# Patient Record
Sex: Female | Born: 1935
Health system: Southern US, Community
[De-identification: ages and names within clinical notes are randomized; demographics above are authoritative.]

## PROBLEM LIST (undated history)

## (undated) DIAGNOSIS — M8580 Other specified disorders of bone density and structure, unspecified site: Secondary | ICD-10-CM

## (undated) DIAGNOSIS — E061 Subacute thyroiditis: Secondary | ICD-10-CM

## (undated) DIAGNOSIS — Z8619 Personal history of other infectious and parasitic diseases: Secondary | ICD-10-CM

## (undated) DIAGNOSIS — C801 Malignant (primary) neoplasm, unspecified: Secondary | ICD-10-CM

## (undated) DIAGNOSIS — D72819 Decreased white blood cell count, unspecified: Secondary | ICD-10-CM

## (undated) DIAGNOSIS — R000 Tachycardia, unspecified: Secondary | ICD-10-CM

## (undated) DIAGNOSIS — M159 Polyosteoarthritis, unspecified: Secondary | ICD-10-CM

## (undated) DIAGNOSIS — E785 Hyperlipidemia, unspecified: Secondary | ICD-10-CM

## (undated) DIAGNOSIS — I10 Essential (primary) hypertension: Secondary | ICD-10-CM

## (undated) DIAGNOSIS — I499 Cardiac arrhythmia, unspecified: Secondary | ICD-10-CM

## (undated) DIAGNOSIS — D509 Iron deficiency anemia, unspecified: Secondary | ICD-10-CM

## (undated) HISTORY — PX: OTHER SURGICAL HISTORY: SHX169

## (undated) HISTORY — PX: EYE SURGERY: SHX253

## (undated) HISTORY — DX: Malignant (primary) neoplasm, unspecified: C80.1

---

## 2001-05-23 HISTORY — PX: CHOLECYSTECTOMY: SHX55

## 2005-01-31 ENCOUNTER — Ambulatory Visit: Payer: Self-pay | Admitting: Internal Medicine

## 2006-02-03 ENCOUNTER — Ambulatory Visit: Payer: Self-pay | Admitting: Internal Medicine

## 2007-02-06 ENCOUNTER — Ambulatory Visit: Payer: Self-pay | Admitting: Internal Medicine

## 2008-02-12 ENCOUNTER — Ambulatory Visit: Payer: Self-pay | Admitting: Internal Medicine

## 2009-02-17 ENCOUNTER — Ambulatory Visit: Payer: Self-pay | Admitting: Internal Medicine

## 2009-10-05 ENCOUNTER — Ambulatory Visit: Payer: Self-pay

## 2010-03-08 ENCOUNTER — Ambulatory Visit: Payer: Self-pay | Admitting: Internal Medicine

## 2011-04-07 ENCOUNTER — Ambulatory Visit: Payer: Self-pay | Admitting: Internal Medicine

## 2012-04-09 ENCOUNTER — Ambulatory Visit: Payer: Self-pay | Admitting: Internal Medicine

## 2013-04-11 ENCOUNTER — Ambulatory Visit: Payer: Self-pay | Admitting: Internal Medicine

## 2014-04-14 ENCOUNTER — Ambulatory Visit: Payer: Self-pay | Admitting: Internal Medicine

## 2014-08-22 ENCOUNTER — Emergency Department: Admit: 2014-08-22 | Discharge status: Against Medical Advice | Payer: Self-pay | Admitting: Emergency Medicine

## 2015-05-27 DIAGNOSIS — R030 Elevated blood-pressure reading, without diagnosis of hypertension: Secondary | ICD-10-CM | POA: Diagnosis not present

## 2015-06-08 DIAGNOSIS — R030 Elevated blood-pressure reading, without diagnosis of hypertension: Secondary | ICD-10-CM | POA: Diagnosis not present

## 2015-06-08 DIAGNOSIS — M8580 Other specified disorders of bone density and structure, unspecified site: Secondary | ICD-10-CM | POA: Diagnosis not present

## 2015-06-08 DIAGNOSIS — E784 Other hyperlipidemia: Secondary | ICD-10-CM | POA: Diagnosis not present

## 2015-06-08 DIAGNOSIS — M150 Primary generalized (osteo)arthritis: Secondary | ICD-10-CM | POA: Diagnosis not present

## 2015-06-08 DIAGNOSIS — E061 Subacute thyroiditis: Secondary | ICD-10-CM | POA: Diagnosis not present

## 2015-06-08 DIAGNOSIS — D72818 Other decreased white blood cell count: Secondary | ICD-10-CM | POA: Diagnosis not present

## 2015-11-30 DIAGNOSIS — E784 Other hyperlipidemia: Secondary | ICD-10-CM | POA: Diagnosis not present

## 2015-11-30 DIAGNOSIS — D72818 Other decreased white blood cell count: Secondary | ICD-10-CM | POA: Diagnosis not present

## 2015-11-30 DIAGNOSIS — R030 Elevated blood-pressure reading, without diagnosis of hypertension: Secondary | ICD-10-CM | POA: Diagnosis not present

## 2015-12-07 DIAGNOSIS — D72818 Other decreased white blood cell count: Secondary | ICD-10-CM | POA: Diagnosis not present

## 2015-12-07 DIAGNOSIS — E061 Subacute thyroiditis: Secondary | ICD-10-CM | POA: Diagnosis not present

## 2015-12-07 DIAGNOSIS — R030 Elevated blood-pressure reading, without diagnosis of hypertension: Secondary | ICD-10-CM | POA: Diagnosis not present

## 2015-12-07 DIAGNOSIS — E784 Other hyperlipidemia: Secondary | ICD-10-CM | POA: Diagnosis not present

## 2015-12-07 DIAGNOSIS — M150 Primary generalized (osteo)arthritis: Secondary | ICD-10-CM | POA: Diagnosis not present

## 2015-12-07 DIAGNOSIS — M8589 Other specified disorders of bone density and structure, multiple sites: Secondary | ICD-10-CM | POA: Diagnosis not present

## 2016-03-18 DIAGNOSIS — E061 Subacute thyroiditis: Secondary | ICD-10-CM | POA: Diagnosis not present

## 2016-03-18 DIAGNOSIS — M150 Primary generalized (osteo)arthritis: Secondary | ICD-10-CM | POA: Diagnosis not present

## 2016-03-18 DIAGNOSIS — E784 Other hyperlipidemia: Secondary | ICD-10-CM | POA: Diagnosis not present

## 2016-03-18 DIAGNOSIS — R030 Elevated blood-pressure reading, without diagnosis of hypertension: Secondary | ICD-10-CM | POA: Diagnosis not present

## 2016-03-25 DIAGNOSIS — E061 Subacute thyroiditis: Secondary | ICD-10-CM | POA: Diagnosis not present

## 2016-03-25 DIAGNOSIS — D72818 Other decreased white blood cell count: Secondary | ICD-10-CM | POA: Diagnosis not present

## 2016-03-25 DIAGNOSIS — R030 Elevated blood-pressure reading, without diagnosis of hypertension: Secondary | ICD-10-CM | POA: Diagnosis not present

## 2016-03-25 DIAGNOSIS — E784 Other hyperlipidemia: Secondary | ICD-10-CM | POA: Diagnosis not present

## 2016-03-25 DIAGNOSIS — M150 Primary generalized (osteo)arthritis: Secondary | ICD-10-CM | POA: Diagnosis not present

## 2016-03-25 DIAGNOSIS — M8589 Other specified disorders of bone density and structure, multiple sites: Secondary | ICD-10-CM | POA: Diagnosis not present

## 2016-03-25 DIAGNOSIS — Z0000 Encounter for general adult medical examination without abnormal findings: Secondary | ICD-10-CM | POA: Diagnosis not present

## 2016-03-25 DIAGNOSIS — Z23 Encounter for immunization: Secondary | ICD-10-CM | POA: Diagnosis not present

## 2016-03-25 DIAGNOSIS — R946 Abnormal results of thyroid function studies: Secondary | ICD-10-CM | POA: Diagnosis not present

## 2016-04-25 DIAGNOSIS — R946 Abnormal results of thyroid function studies: Secondary | ICD-10-CM | POA: Diagnosis not present

## 2016-07-01 DIAGNOSIS — R000 Tachycardia, unspecified: Secondary | ICD-10-CM | POA: Diagnosis not present

## 2016-07-01 DIAGNOSIS — R030 Elevated blood-pressure reading, without diagnosis of hypertension: Secondary | ICD-10-CM | POA: Diagnosis not present

## 2016-09-15 DIAGNOSIS — D72818 Other decreased white blood cell count: Secondary | ICD-10-CM | POA: Diagnosis not present

## 2016-09-15 DIAGNOSIS — E784 Other hyperlipidemia: Secondary | ICD-10-CM | POA: Diagnosis not present

## 2016-09-15 DIAGNOSIS — R030 Elevated blood-pressure reading, without diagnosis of hypertension: Secondary | ICD-10-CM | POA: Diagnosis not present

## 2016-09-15 DIAGNOSIS — M150 Primary generalized (osteo)arthritis: Secondary | ICD-10-CM | POA: Diagnosis not present

## 2016-09-15 DIAGNOSIS — R946 Abnormal results of thyroid function studies: Secondary | ICD-10-CM | POA: Diagnosis not present

## 2016-09-21 DIAGNOSIS — Z1211 Encounter for screening for malignant neoplasm of colon: Secondary | ICD-10-CM | POA: Diagnosis not present

## 2016-09-22 DIAGNOSIS — M150 Primary generalized (osteo)arthritis: Secondary | ICD-10-CM | POA: Diagnosis not present

## 2016-09-22 DIAGNOSIS — R202 Paresthesia of skin: Secondary | ICD-10-CM | POA: Diagnosis not present

## 2016-09-22 DIAGNOSIS — D649 Anemia, unspecified: Secondary | ICD-10-CM | POA: Insufficient documentation

## 2016-09-22 DIAGNOSIS — E784 Other hyperlipidemia: Secondary | ICD-10-CM | POA: Diagnosis not present

## 2016-09-22 DIAGNOSIS — D72818 Other decreased white blood cell count: Secondary | ICD-10-CM | POA: Diagnosis not present

## 2016-09-22 DIAGNOSIS — M8589 Other specified disorders of bone density and structure, multiple sites: Secondary | ICD-10-CM | POA: Diagnosis not present

## 2016-09-22 DIAGNOSIS — E061 Subacute thyroiditis: Secondary | ICD-10-CM | POA: Diagnosis not present

## 2016-09-22 DIAGNOSIS — R030 Elevated blood-pressure reading, without diagnosis of hypertension: Secondary | ICD-10-CM | POA: Diagnosis not present

## 2016-09-26 ENCOUNTER — Inpatient Hospital Stay: Payer: PPO | Attending: Oncology | Admitting: Oncology

## 2016-09-26 ENCOUNTER — Encounter: Payer: Self-pay | Admitting: Oncology

## 2016-09-26 ENCOUNTER — Other Ambulatory Visit: Payer: Self-pay | Admitting: Oncology

## 2016-09-26 ENCOUNTER — Ambulatory Visit: Payer: Self-pay

## 2016-09-26 ENCOUNTER — Inpatient Hospital Stay: Payer: PPO

## 2016-09-26 VITALS — BP 149/74 | HR 101 | Temp 98.0°F | Ht 65.5 in | Wt 130.5 lb

## 2016-09-26 VITALS — BP 150/72 | HR 98 | Temp 97.6°F | Resp 20

## 2016-09-26 DIAGNOSIS — Z79899 Other long term (current) drug therapy: Secondary | ICD-10-CM | POA: Insufficient documentation

## 2016-09-26 DIAGNOSIS — R002 Palpitations: Secondary | ICD-10-CM | POA: Insufficient documentation

## 2016-09-26 DIAGNOSIS — E785 Hyperlipidemia, unspecified: Secondary | ICD-10-CM | POA: Diagnosis not present

## 2016-09-26 DIAGNOSIS — M8580 Other specified disorders of bone density and structure, unspecified site: Secondary | ICD-10-CM | POA: Diagnosis not present

## 2016-09-26 DIAGNOSIS — R531 Weakness: Secondary | ICD-10-CM | POA: Insufficient documentation

## 2016-09-26 DIAGNOSIS — R42 Dizziness and giddiness: Secondary | ICD-10-CM

## 2016-09-26 DIAGNOSIS — R5383 Other fatigue: Secondary | ICD-10-CM | POA: Diagnosis not present

## 2016-09-26 DIAGNOSIS — R000 Tachycardia, unspecified: Secondary | ICD-10-CM | POA: Insufficient documentation

## 2016-09-26 DIAGNOSIS — Z9049 Acquired absence of other specified parts of digestive tract: Secondary | ICD-10-CM | POA: Diagnosis not present

## 2016-09-26 DIAGNOSIS — D509 Iron deficiency anemia, unspecified: Secondary | ICD-10-CM

## 2016-09-26 DIAGNOSIS — E061 Subacute thyroiditis: Secondary | ICD-10-CM | POA: Diagnosis not present

## 2016-09-26 DIAGNOSIS — R030 Elevated blood-pressure reading, without diagnosis of hypertension: Secondary | ICD-10-CM | POA: Insufficient documentation

## 2016-09-26 DIAGNOSIS — I10 Essential (primary) hypertension: Secondary | ICD-10-CM | POA: Insufficient documentation

## 2016-09-26 DIAGNOSIS — R0602 Shortness of breath: Secondary | ICD-10-CM | POA: Insufficient documentation

## 2016-09-26 LAB — CBC WITH DIFFERENTIAL/PLATELET
BASOS PCT: 1 %
Basophils Absolute: 0 10*3/uL (ref 0–0.1)
EOS ABS: 0 10*3/uL (ref 0–0.7)
Eosinophils Relative: 1 %
HCT: 24 % — ABNORMAL LOW (ref 35.0–47.0)
Hemoglobin: 7.1 g/dL — ABNORMAL LOW (ref 12.0–16.0)
Lymphocytes Relative: 26 %
Lymphs Abs: 1.1 10*3/uL (ref 1.0–3.6)
MCH: 20.3 pg — ABNORMAL LOW (ref 26.0–34.0)
MCHC: 29.6 g/dL — AB (ref 32.0–36.0)
MCV: 68.6 fL — ABNORMAL LOW (ref 80.0–100.0)
MONO ABS: 0.4 10*3/uL (ref 0.2–0.9)
MONOS PCT: 10 %
Neutro Abs: 2.7 10*3/uL (ref 1.4–6.5)
Neutrophils Relative %: 62 %
Platelets: 413 10*3/uL (ref 150–440)
RBC: 3.5 MIL/uL — ABNORMAL LOW (ref 3.80–5.20)
RDW: 19.3 % — ABNORMAL HIGH (ref 11.5–14.5)
WBC: 4.3 10*3/uL (ref 3.6–11.0)

## 2016-09-26 LAB — SAMPLE TO BLOOD BANK

## 2016-09-26 LAB — VITAMIN B12: VITAMIN B 12: 1454 pg/mL — AB (ref 180–914)

## 2016-09-26 MED ORDER — SODIUM CHLORIDE 0.9 % IV SOLN
Freq: Once | INTRAVENOUS | Status: AC
  Administered 2016-09-26: 10:00:00 via INTRAVENOUS
  Filled 2016-09-26: qty 1000

## 2016-09-26 MED ORDER — SODIUM CHLORIDE 0.9 % IV SOLN
510.0000 mg | Freq: Once | INTRAVENOUS | Status: AC
  Administered 2016-09-26: 510 mg via INTRAVENOUS
  Filled 2016-09-26: qty 17

## 2016-09-26 NOTE — Addendum Note (Signed)
Addended by: Luella Cook on: 09/26/2016 10:06 AM   Modules accepted: Orders

## 2016-09-26 NOTE — Addendum Note (Signed)
Addended by: Luella Cook on: 09/26/2016 09:23 AM   Modules accepted: Orders

## 2016-09-26 NOTE — Addendum Note (Signed)
Addended by: Luella Cook on: 09/26/2016 10:17 AM   Modules accepted: Orders

## 2016-09-26 NOTE — Patient Instructions (Signed)

## 2016-09-26 NOTE — Progress Notes (Signed)
Hematology/Oncology Consult note [facility] Telephone:([phone] [phone]  No care team member to display   Name of the patient: Sara Smith  [id number]  [birth year]    Reason for referral- iron deficiency anemia   Referring physician- Dr. Caryl Comes  Date of visit: 09/26/16   History of presenting illness- patient is a 81 year old female with a past medical history significant for hypertension and hyperlipidemia, and other medical problems. She was seen by Dr. Cleda Mccreedy her primary care doctor for symptoms of shortness of breath on exertion and blood work revealed significant anemia. Hemoccult cards were negative for blood. Most recent CBC from 09/22/2016 showed white count of 5.2, H&H of 7.0/23.8 with an MCV of 70.8 and platelet count of 425. Iron studies revealed ferratin of 4 and total iron at T12. Folate acid was within normal limits. TSH was normal at 3.8. Urinalysis was negative for hematuria.In Oct 2017 her H/H was 12.9/39.2  She has never seen GI and never had EGD or colonoscopy. Denies any blood in stools or urine. Denies frequent use of NSAIDS. No family h/o colon cancer. h/o 14 pound weight loss intentionally through diet. Reports fatigue, palpitations and sob on exertion. Occasional lightheadedness on standing. She is not currently on iron supplements. Denies any gum bleeds or nose bleeds  ECOG PS- 1  Pain scale- 0   Review of systems- Review of Systems  Constitutional: Positive for malaise/fatigue and weight loss. Negative for chills and fever.  HENT: Negative for congestion, ear discharge and nosebleeds.   Eyes: Negative for blurred vision.  Respiratory: Positive for shortness of breath. Negative for cough, hemoptysis, sputum production and wheezing.   Cardiovascular: Positive for palpitations. Negative for chest pain, orthopnea and claudication.  Gastrointestinal: Negative for abdominal pain, blood in stool, constipation, diarrhea,  heartburn, melena, nausea and vomiting.  Genitourinary: Negative for dysuria, flank pain, frequency, hematuria and urgency.  Musculoskeletal: Negative for back pain, joint pain and myalgias.  Skin: Negative for rash.  Neurological: Positive for weakness. Negative for dizziness, tingling, focal weakness, seizures and headaches.  Endo/Heme/Allergies: Does not bruise/bleed easily.  Psychiatric/Behavioral: Negative for depression and suicidal ideas. The patient does not have insomnia.     Allergies  Allergen Reactions  . Calcitonin ([place]) Other (See Comments)  . Ciprofloxacin Nausea Only  . Risedronate Other (See Comments)    intolerant of secondary to esophagitis    Patient Active Problem List   Diagnosis Date Noted  . Borderline hypertension 09/26/2016  . Hyperlipidemia, unspecified 09/26/2016  . Osteopenia 09/26/2016  . Anemia, unspecified 09/22/2016  . Tachycardia 07/01/2016  . Subacute thyroiditis 08/21/2009     Past Medical History:  Diagnosis Date  . Arthritis      Past Surgical History:  Procedure Laterality Date  . CHOLECYSTECTOMY    . hyperlipedemia N/A   . irregular HR      Social History   Social History  . Marital status: Married    Spouse name: N/A  . Number of children: N/A  . Years of education: N/A   Occupational History  . Not on file.   Social History Main Topics  . Smoking status: Never Smoker  . Smokeless tobacco: Never Used  . Alcohol use Yes  . Drug use: No  . Sexual activity: Not on file   Other Topics Concern  . Not on file   Social History Narrative  . No narrative on file     Family History  Problem Relation Age of Onset  .  Heart disease Father   . Hypertension Father      Current Outpatient Prescriptions:  .  atorvastatin (LIPITOR) 10 MG tablet, Take by mouth., Disp: , Rfl:  .  azelastine (ASTELIN) 0.1 % nasal spray, instill 2 sprays into each nostril twice a day for 7 days then twice a day if needed, Disp: , Rfl:    .  B Complex Vitamins (B-COMPLEX/B-12) LIQD, Place under the tongue., Disp: , Rfl:  .  calcium-vitamin D (OSCAL WITH D) 250-125 MG-UNIT tablet, Take 1 tablet by mouth daily., Disp: , Rfl:  .  Multiple Vitamins-Minerals (GLUCOTEN) CAPS, Take by mouth., Disp: , Rfl:  .  Multiple Vitamins-Minerals (MULTIVITAMIN WITH MINERALS) tablet, Take by mouth., Disp: , Rfl:  .  pantoprazole (PROTONIX) 40 MG tablet, Take by mouth., Disp: , Rfl:  .  tretinoin (RETIN-A) 0.1 % cream, Apply topically., Disp: , Rfl:    Physical exam:  Vitals:   09/26/16 0834  BP: (!) 149/74  Pulse: (!) 101  Temp: 98 F (36.7 C)  TempSrc: Tympanic  Weight: 130 lb 8.2 oz (59.2 kg)  Height: 5' 5.5" (1.664 m)   Physical Exam  Constitutional: She is oriented to person, place, and time and well-developed, well-nourished, and in no distress.  HENT:  Head: Normocephalic and atraumatic.  Eyes: EOM are normal. Pupils are equal, round, and reactive to light.  Neck: Normal range of motion.  Cardiovascular: Regular rhythm and normal heart sounds.   tachycardic  Pulmonary/Chest: Effort normal and breath sounds normal.  Abdominal: Soft. Bowel sounds are normal.  Neurological: She is alert and oriented to person, place, and time.  Skin: Skin is warm and dry.       No flowsheet data found. No flowsheet data found.  No images are attached to the encounter.  No results found.  Assessment and plan- Patient is a 81 y.o. female referred to [place] for iron deficiency anemia  Recent blood work revealed severe iron deficiency and given that her hemoglobin most recently was 7, I will plan to start IV iron feraheme 510 mg X 2 doses weekly. She will start taking oral iron 325 mg BID every other day after 2 doses of IV iron  I discussed risks and benefits  with of IV iron including all but not limited to fatigue, leg swelling, headache and risk of transfusion recation. Patient understands and agrees to proceed.  I will check cbc today  along with b12, celiac disease panel and stool H pylori ag and see if she needs blood transfusion prior. I will plan to repeat iron studies, retic count,  in about 8 weeks from now. I will also refer her to GI for her iron deficiency anemia. I will see her back in 8 weeks for possible IV iron at that time   Thank you for this kind referral and the opportunity to participate in the care of this patient   Visit Diagnosis 1. Iron deficiency anemia, unspecified iron deficiency anemia type     Dr. Randa Evens, MD, MPH [facility] Pager- [phone] 09/26/2016  9:16 AM

## 2016-09-26 NOTE — Progress Notes (Signed)
Patient here for initial visit. Her BP and HR are elevated today. She states she was rushed today to get here.

## 2016-09-27 ENCOUNTER — Inpatient Hospital Stay: Payer: PPO

## 2016-09-27 DIAGNOSIS — D509 Iron deficiency anemia, unspecified: Secondary | ICD-10-CM

## 2016-09-27 LAB — PREPARE RBC (CROSSMATCH)

## 2016-09-27 LAB — ABO/RH: ABO/RH(D): A POS

## 2016-09-27 MED ORDER — SODIUM CHLORIDE 0.9 % IV SOLN
250.0000 mL | Freq: Once | INTRAVENOUS | Status: AC
  Administered 2016-09-27: 250 mL via INTRAVENOUS
  Filled 2016-09-27: qty 250

## 2016-09-27 MED ORDER — ACETAMINOPHEN 325 MG PO TABS
650.0000 mg | ORAL_TABLET | Freq: Once | ORAL | Status: AC
  Administered 2016-09-27: 650 mg via ORAL
  Filled 2016-09-27: qty 2

## 2016-09-28 LAB — TYPE AND SCREEN
ABO/RH(D): A POS
ANTIBODY SCREEN: NEGATIVE
Unit division: 0

## 2016-09-28 LAB — CELIAC DISEASE PANEL
Endomysial Ab, IgA: NEGATIVE
IgA: 191 mg/dL (ref 64–422)
Tissue Transglutaminase Ab, IgA: 20 U/mL — ABNORMAL HIGH (ref 0–3)

## 2016-09-28 LAB — BPAM RBC
Blood Product Expiration Date: 201805152359
ISSUE DATE / TIME: 201805081116
Unit Type and Rh: 6200

## 2016-09-29 ENCOUNTER — Encounter: Payer: Self-pay | Admitting: Internal Medicine

## 2016-09-29 LAB — H. PYLORI ANTIGEN, STOOL: H. Pylori Stool Ag, Eia: POSITIVE — AB

## 2016-09-29 NOTE — Progress Notes (Signed)
Protonix 40 BID+ clarithromycin 500 BID +amox 1gm twice daily for 14 days

## 2016-09-30 ENCOUNTER — Telehealth: Payer: Self-pay | Admitting: *Deleted

## 2016-09-30 ENCOUNTER — Other Ambulatory Visit: Payer: Self-pay | Admitting: *Deleted

## 2016-09-30 DIAGNOSIS — E061 Subacute thyroiditis: Secondary | ICD-10-CM | POA: Diagnosis not present

## 2016-09-30 DIAGNOSIS — D649 Anemia, unspecified: Secondary | ICD-10-CM | POA: Diagnosis not present

## 2016-09-30 DIAGNOSIS — E784 Other hyperlipidemia: Secondary | ICD-10-CM | POA: Diagnosis not present

## 2016-09-30 DIAGNOSIS — A048 Other specified bacterial intestinal infections: Secondary | ICD-10-CM

## 2016-09-30 DIAGNOSIS — R000 Tachycardia, unspecified: Secondary | ICD-10-CM | POA: Diagnosis not present

## 2016-09-30 MED ORDER — CLARITHROMYCIN 500 MG PO TABS: 500.0000 mg | ORAL_TABLET | Freq: Two times a day (BID) | ORAL | 0 refills | Status: DC

## 2016-09-30 MED ORDER — PANTOPRAZOLE SODIUM 40 MG PO TBEC: 40.0000 mg | DELAYED_RELEASE_TABLET | Freq: Two times a day (BID) | ORAL | 0 refills | Status: AC

## 2016-09-30 MED ORDER — AMOXICILLIN 500 MG PO TABS: 1000.0000 mg | ORAL_TABLET | Freq: Two times a day (BID) | ORAL | 0 refills | Status: DC

## 2016-09-30 NOTE — Telephone Encounter (Signed)
Called pt to let her know that the stool test she had done did come back with positive for H Pylori and she will need to be on 2 atb and 1 stomach pill for 14 days.  After she has completed the regimen she needs to call me. I will need to test her again about10 days after completing the atb and see if the bacteria is gone. She is agreeable to the plan. I gave her the direct number to me and she uses [facility] on [address]. It was electronically sent to her pharmacy. She also wants to cancel appt with GI Dr. Allen Norris.  She already has one with [facility] and her husband sees [facility] and she would like to go there. I called and cancelled Dr. Allen Norris appt left message on the GI line to cancel. Patient aware that I was cancelling the appt.

## 2016-09-30 NOTE — Telephone Encounter (Signed)
-----   Message from Sindy Guadeloupe, MD sent at 09/29/2016  8:12 AM EDT ----- Protonix 40 BID+ clarithromycin 500 BID +amox 1gm twice daily for 14 days

## 2016-09-30 NOTE — Telephone Encounter (Signed)
Called pt back and left her a message that she should hold her regular protonix that she takes daily and take the rx we sent for the same drug but twice a day and after she completes the atb she can go back to her usual daily dose.  Also there is a risk of liver issues if pt takes the cholesterol pil atorvastatin with the atb so we would like her to hold the atorvastatin until she completes the atb then resume the atorvastatin after she completes the atb. I have asked her to call me to make sure she understands directions and that she does not have any questions for me

## 2016-11-01 ENCOUNTER — Ambulatory Visit: Payer: Self-pay | Admitting: Gastroenterology

## 2016-11-01 DIAGNOSIS — R894 Abnormal immunological findings in specimens from other organs, systems and tissues: Secondary | ICD-10-CM | POA: Diagnosis not present

## 2016-11-01 DIAGNOSIS — Z8619 Personal history of other infectious and parasitic diseases: Secondary | ICD-10-CM | POA: Diagnosis not present

## 2016-11-01 DIAGNOSIS — D509 Iron deficiency anemia, unspecified: Secondary | ICD-10-CM | POA: Diagnosis not present

## 2016-11-14 DIAGNOSIS — R030 Elevated blood-pressure reading, without diagnosis of hypertension: Secondary | ICD-10-CM | POA: Diagnosis not present

## 2016-11-14 DIAGNOSIS — M150 Primary generalized (osteo)arthritis: Secondary | ICD-10-CM | POA: Diagnosis not present

## 2016-11-14 DIAGNOSIS — E784 Other hyperlipidemia: Secondary | ICD-10-CM | POA: Diagnosis not present

## 2016-11-14 DIAGNOSIS — D72818 Other decreased white blood cell count: Secondary | ICD-10-CM | POA: Diagnosis not present

## 2016-11-14 DIAGNOSIS — E061 Subacute thyroiditis: Secondary | ICD-10-CM | POA: Diagnosis not present

## 2016-11-14 DIAGNOSIS — D509 Iron deficiency anemia, unspecified: Secondary | ICD-10-CM | POA: Diagnosis not present

## 2016-11-14 DIAGNOSIS — M8589 Other specified disorders of bone density and structure, multiple sites: Secondary | ICD-10-CM | POA: Diagnosis not present

## 2016-11-14 DIAGNOSIS — R000 Tachycardia, unspecified: Secondary | ICD-10-CM | POA: Diagnosis not present

## 2016-11-15 ENCOUNTER — Other Ambulatory Visit: Payer: Self-pay | Admitting: Oncology

## 2016-11-15 DIAGNOSIS — A048 Other specified bacterial intestinal infections: Secondary | ICD-10-CM

## 2016-11-22 ENCOUNTER — Inpatient Hospital Stay: Payer: PPO | Attending: Oncology | Admitting: Oncology

## 2016-11-22 ENCOUNTER — Inpatient Hospital Stay: Payer: PPO

## 2016-11-22 ENCOUNTER — Encounter: Payer: Self-pay | Admitting: Oncology

## 2016-11-22 VITALS — BP 129/75 | HR 87 | Temp 98.2°F | Resp 18 | Wt 129.5 lb

## 2016-11-22 DIAGNOSIS — B9681 Helicobacter pylori [H. pylori] as the cause of diseases classified elsewhere: Secondary | ICD-10-CM | POA: Diagnosis not present

## 2016-11-22 DIAGNOSIS — R5383 Other fatigue: Secondary | ICD-10-CM | POA: Diagnosis not present

## 2016-11-22 DIAGNOSIS — I10 Essential (primary) hypertension: Secondary | ICD-10-CM | POA: Insufficient documentation

## 2016-11-22 DIAGNOSIS — Z79899 Other long term (current) drug therapy: Secondary | ICD-10-CM | POA: Diagnosis not present

## 2016-11-22 DIAGNOSIS — R0602 Shortness of breath: Secondary | ICD-10-CM | POA: Insufficient documentation

## 2016-11-22 DIAGNOSIS — E785 Hyperlipidemia, unspecified: Secondary | ICD-10-CM | POA: Insufficient documentation

## 2016-11-22 DIAGNOSIS — R42 Dizziness and giddiness: Secondary | ICD-10-CM | POA: Insufficient documentation

## 2016-11-22 DIAGNOSIS — R002 Palpitations: Secondary | ICD-10-CM

## 2016-11-22 DIAGNOSIS — D509 Iron deficiency anemia, unspecified: Secondary | ICD-10-CM

## 2016-11-22 LAB — CBC
HEMATOCRIT: 33.3 % — AB (ref 35.0–47.0)
HEMOGLOBIN: 11 g/dL — AB (ref 12.0–16.0)
MCH: 25.6 pg — ABNORMAL LOW (ref 26.0–34.0)
MCHC: 33.1 g/dL (ref 32.0–36.0)
MCV: 77.3 fL — ABNORMAL LOW (ref 80.0–100.0)
Platelets: 308 10*3/uL (ref 150–440)
RBC: 4.31 MIL/uL (ref 3.80–5.20)
RDW: 26.1 % — AB (ref 11.5–14.5)
WBC: 5.3 10*3/uL (ref 3.6–11.0)

## 2016-11-22 LAB — FERRITIN: Ferritin: 7 ng/mL — ABNORMAL LOW (ref 11–307)

## 2016-11-22 LAB — IRON AND TIBC
IRON: 32 ug/dL (ref 28–170)
SATURATION RATIOS: 8 % — AB (ref 10.4–31.8)
TIBC: 416 ug/dL (ref 250–450)
UIBC: 384 ug/dL

## 2016-11-22 NOTE — Progress Notes (Signed)
Hematology/Oncology Consult note [facility]  Telephone:([phone] Fax:([phone]  Patient Care Team: Adin Hector, MD as PCP - General (Internal Medicine)   Name of the patient: Sara Smith  [id number]  [birth year]   Date of visit: 11/22/16  Diagnosis- iron deficiency anemia  Chief complaint/ Reason for visit- routine f/u  Heme/Onc history: patient is a 81 year old female with a past medical history significant for hypertension and hyperlipidemia, and other medical problems. She was seen by Dr. Cleda Mccreedy her primary care doctor for symptoms of shortness of breath on exertion and blood work revealed significant anemia. Hemoccult cards were negative for blood. Most recent CBC from 09/22/2016 showed white count of 5.2, H&H of 7.0/23.8 with an MCV of 70.8 and platelet count of 425. Iron studies revealed ferratin of 4 and total iron at T12. Folate acid was within normal limits. TSH was normal at 3.8. Urinalysis was negative for hematuria.In Oct 2017 her H/H was 12.9/39.2  She has never seen GI and never had EGD or colonoscopy. Denies any blood in stools or urine. Denies frequent use of NSAIDS. No family h/o colon cancer. h/o 14 pound weight loss intentionally through diet. Reports fatigue, palpitations and sob on exertion. Occasional lightheadedness on standing. She is not currently on iron supplements. Denies any gum bleeds or nose bleeds  Results of bloodwork from 09/26/2016 breast follows: CBC showed white count of 4.3, H&H of 7.1/24 with an MCV of 68.6 and a platelet count of 413. Celiac panel showed elevated TBG IgA at 20. B12 was elevated at 1454. Stool H. pylori antigen was positiv and was treated  Patient was seen at [facility] GI and is scheduled to undergo EGD and colonoscopy   Interval history- she reports doing significantly better. She is eating better and does not report feeling fatigued. She only received 1 dose of IV iron and would  like to hold off on further doses for now  ECOG PS- 1 Pain scale- 0  Review of systems- Review of Systems  Constitutional: Negative for chills, fever, malaise/fatigue and weight loss.  HENT: Negative for congestion, ear discharge and nosebleeds.   Eyes: Negative for blurred vision.  Respiratory: Negative for cough, hemoptysis, sputum production, shortness of breath and wheezing.   Cardiovascular: Negative for chest pain, palpitations, orthopnea and claudication.  Gastrointestinal: Negative for abdominal pain, blood in stool, constipation, diarrhea, heartburn, melena, nausea and vomiting.  Genitourinary: Negative for dysuria, flank pain, frequency, hematuria and urgency.  Musculoskeletal: Negative for back pain, joint pain and myalgias.  Skin: Negative for rash.  Neurological: Negative for dizziness, tingling, focal weakness, seizures, weakness and headaches.  Endo/Heme/Allergies: Does not bruise/bleed easily.  Psychiatric/Behavioral: Negative for depression and suicidal ideas. The patient does not have insomnia.       Allergies  Allergen Reactions  . Calcitonin ([place]) Other (See Comments)  . Ciprofloxacin Nausea Only  . Risedronate Other (See Comments)    intolerant of secondary to esophagitis     Past Medical History:  Diagnosis Date  . Arthritis      Past Surgical History:  Procedure Laterality Date  . CHOLECYSTECTOMY    . hyperlipedemia N/A   . irregular HR      Social History   Social History  . Marital status: Married    Spouse name: N/A  . Number of children: N/A  . Years of education: N/A   Occupational History  . Not on file.   Social History Main Topics  .  Smoking status: Never Smoker  . Smokeless tobacco: Never Used  . Alcohol use Yes  . Drug use: No  . Sexual activity: Not on file   Other Topics Concern  . Not on file   Social History Narrative  . No narrative on file    Family History  Problem Relation Age of Onset  . Heart disease  Father   . Hypertension Father      Current Outpatient Prescriptions:  .  amoxicillin (AMOXIL) 500 MG tablet, Take 2 tablets (1,000 mg total) by mouth 2 (two) times daily., Disp: 56 tablet, Rfl: 0 .  atorvastatin (LIPITOR) 10 MG tablet, Take by mouth., Disp: , Rfl:  .  azelastine (ASTELIN) 0.1 % nasal spray, instill 2 sprays into each nostril twice a day for 7 days then twice a day if needed, Disp: , Rfl:  .  B Complex Vitamins (B-COMPLEX/B-12) LIQD, Place under the tongue., Disp: , Rfl:  .  calcium-vitamin D (OSCAL WITH D) 250-125 MG-UNIT tablet, Take 1 tablet by mouth daily., Disp: , Rfl:  .  clarithromycin (BIAXIN) 500 MG tablet, Take 1 tablet (500 mg total) by mouth 2 (two) times daily., Disp: 28 tablet, Rfl: 0 .  Multiple Vitamins-Minerals (GLUCOTEN) CAPS, Take by mouth., Disp: , Rfl:  .  Multiple Vitamins-Minerals (MULTIVITAMIN WITH MINERALS) tablet, Take by mouth., Disp: , Rfl:  .  pantoprazole (PROTONIX) 40 MG tablet, Take by mouth., Disp: , Rfl:  .  pantoprazole (PROTONIX) 40 MG tablet, Take 1 tablet (40 mg total) by mouth 2 (two) times daily., Disp: 28 tablet, Rfl: 0 .  tretinoin (RETIN-A) 0.1 % cream, Apply topically., Disp: , Rfl:   Physical exam:  Vitals:   11/22/16 0922  BP: 129/75  Pulse: 87  Resp: 18  Temp: 98.2 F (36.8 C)  TempSrc: Tympanic  Weight: 129 lb 8 oz (58.7 kg)   Physical Exam  Constitutional: She is oriented to person, place, and time and well-developed, well-nourished, and in no distress.  HENT:  Head: Normocephalic and atraumatic.  Eyes: EOM are normal. Pupils are equal, round, and reactive to light.  Neck: Normal range of motion.  Cardiovascular: Normal rate, regular rhythm and normal heart sounds.   Pulmonary/Chest: Effort normal and breath sounds normal.  Abdominal: Soft. Bowel sounds are normal.  Neurological: She is alert and oriented to person, place, and time.  Skin: Skin is warm and dry.     No flowsheet data found. CBC Latest Ref Rng  & Units 09/26/2016  WBC 3.6 - 11.0 K/uL 4.3  Hemoglobin 12.0 - 16.0 g/dL 7.1(L)  Hematocrit 35.0 - 47.0 % 24.0(L)  Platelets 150 - 440 K/uL 413    Assessment and plan- Patient is a 81 y.o. female with severe iron deficiency anemia likely secondary to GI bleeding  Most recent CBC from 11/14/2016 showed H&H of 10.3/32.6. Iron studies revealed a low ferritin of 8 and low serum iron of 21. Patioent wishes to take oral iron at this time. I will check cbc ferritin and iron studies in 3 and 6 months and see her back in 6 months  She needs to be off protonix for 2 weeks and we will repeat stool H pylori ag that time to assess for eradication. Also check UA at that time    Visit Diagnosis 1. Iron deficiency anemia, unspecified iron deficiency anemia type      Dr. Randa Evens, MD, MPH [facility] Pager- [phone] 11/22/2016 12:47 PM

## 2016-11-22 NOTE — Progress Notes (Signed)
Patient offers no complaints today. 

## 2016-12-05 DIAGNOSIS — D509 Iron deficiency anemia, unspecified: Secondary | ICD-10-CM | POA: Diagnosis not present

## 2016-12-06 ENCOUNTER — Inpatient Hospital Stay: Payer: PPO

## 2016-12-06 DIAGNOSIS — D509 Iron deficiency anemia, unspecified: Secondary | ICD-10-CM

## 2016-12-06 LAB — URINALYSIS, COMPLETE (UACMP) WITH MICROSCOPIC
BILIRUBIN URINE: NEGATIVE
Bacteria, UA: NONE SEEN
Glucose, UA: NEGATIVE mg/dL
Hgb urine dipstick: NEGATIVE
KETONES UR: NEGATIVE mg/dL
LEUKOCYTES UA: NEGATIVE
Nitrite: NEGATIVE
PH: 6 (ref 5.0–8.0)
PROTEIN: NEGATIVE mg/dL
SQUAMOUS EPITHELIAL / LPF: NONE SEEN
Specific Gravity, Urine: 1.014 (ref 1.005–1.030)

## 2016-12-08 LAB — H. PYLORI ANTIGEN, STOOL: H. PYLORI STOOL AG, EIA: NEGATIVE

## 2016-12-13 DIAGNOSIS — D649 Anemia, unspecified: Secondary | ICD-10-CM | POA: Diagnosis not present

## 2016-12-13 DIAGNOSIS — E784 Other hyperlipidemia: Secondary | ICD-10-CM | POA: Diagnosis not present

## 2016-12-13 DIAGNOSIS — M8589 Other specified disorders of bone density and structure, multiple sites: Secondary | ICD-10-CM | POA: Diagnosis not present

## 2016-12-13 DIAGNOSIS — R030 Elevated blood-pressure reading, without diagnosis of hypertension: Secondary | ICD-10-CM | POA: Diagnosis not present

## 2016-12-13 DIAGNOSIS — E061 Subacute thyroiditis: Secondary | ICD-10-CM | POA: Diagnosis not present

## 2016-12-13 DIAGNOSIS — R000 Tachycardia, unspecified: Secondary | ICD-10-CM | POA: Diagnosis not present

## 2016-12-13 DIAGNOSIS — D509 Iron deficiency anemia, unspecified: Secondary | ICD-10-CM | POA: Diagnosis not present

## 2017-02-07 DIAGNOSIS — D649 Anemia, unspecified: Secondary | ICD-10-CM | POA: Diagnosis not present

## 2017-02-22 ENCOUNTER — Inpatient Hospital Stay: Payer: PPO | Attending: Oncology

## 2017-02-22 DIAGNOSIS — D509 Iron deficiency anemia, unspecified: Secondary | ICD-10-CM | POA: Insufficient documentation

## 2017-02-22 LAB — IRON AND TIBC
Iron: 49 ug/dL (ref 28–170)
SATURATION RATIOS: 13 % (ref 10.4–31.8)
TIBC: 381 ug/dL (ref 250–450)
UIBC: 332 ug/dL

## 2017-02-22 LAB — CBC
HEMATOCRIT: 35.9 % (ref 35.0–47.0)
Hemoglobin: 12.2 g/dL (ref 12.0–16.0)
MCH: 30.3 pg (ref 26.0–34.0)
MCHC: 34 g/dL (ref 32.0–36.0)
MCV: 89 fL (ref 80.0–100.0)
PLATELETS: 293 10*3/uL (ref 150–440)
RBC: 4.03 MIL/uL (ref 3.80–5.20)
RDW: 16.8 % — ABNORMAL HIGH (ref 11.5–14.5)
WBC: 6 10*3/uL (ref 3.6–11.0)

## 2017-02-22 LAB — FERRITIN: Ferritin: 11 ng/mL (ref 11–307)

## 2017-02-23 ENCOUNTER — Telehealth: Payer: Self-pay | Admitting: *Deleted

## 2017-02-23 ENCOUNTER — Other Ambulatory Visit: Payer: Self-pay | Admitting: *Deleted

## 2017-02-23 NOTE — Telephone Encounter (Signed)
Spoke with patient regarding her lab results. She stated that she is feeling very well, better than she has in a long time, and does not want IV iron at this time. Patient was instructed to call our office if she feels weak, fatigued or short of breath.

## 2017-03-20 ENCOUNTER — Encounter: Payer: Self-pay | Admitting: *Deleted

## 2017-03-21 ENCOUNTER — Encounter: Payer: Self-pay | Admitting: Anesthesiology

## 2017-03-21 ENCOUNTER — Ambulatory Visit: Payer: PPO | Admitting: Anesthesiology

## 2017-03-21 ENCOUNTER — Ambulatory Visit
Admission: RE | Admit: 2017-03-21 | Discharge: 2017-03-21 | Disposition: A | Discharge status: Home / Self Care | Payer: PPO | Source: Ambulatory Visit | Attending: Gastroenterology | Admitting: Gastroenterology

## 2017-03-21 ENCOUNTER — Encounter
Admission: RE | Disposition: A | Discharge status: Home / Self Care | Payer: Self-pay | Source: Ambulatory Visit | Attending: Gastroenterology

## 2017-03-21 DIAGNOSIS — M1990 Unspecified osteoarthritis, unspecified site: Secondary | ICD-10-CM | POA: Diagnosis not present

## 2017-03-21 DIAGNOSIS — K2950 Unspecified chronic gastritis without bleeding: Secondary | ICD-10-CM | POA: Diagnosis not present

## 2017-03-21 DIAGNOSIS — D509 Iron deficiency anemia, unspecified: Secondary | ICD-10-CM | POA: Insufficient documentation

## 2017-03-21 DIAGNOSIS — Z8619 Personal history of other infectious and parasitic diseases: Secondary | ICD-10-CM | POA: Diagnosis not present

## 2017-03-21 DIAGNOSIS — E785 Hyperlipidemia, unspecified: Secondary | ICD-10-CM | POA: Diagnosis not present

## 2017-03-21 DIAGNOSIS — K3189 Other diseases of stomach and duodenum: Secondary | ICD-10-CM | POA: Insufficient documentation

## 2017-03-21 DIAGNOSIS — I10 Essential (primary) hypertension: Secondary | ICD-10-CM | POA: Insufficient documentation

## 2017-03-21 DIAGNOSIS — Z79899 Other long term (current) drug therapy: Secondary | ICD-10-CM | POA: Insufficient documentation

## 2017-03-21 DIAGNOSIS — K2970 Gastritis, unspecified, without bleeding: Secondary | ICD-10-CM | POA: Diagnosis not present

## 2017-03-21 DIAGNOSIS — K2960 Other gastritis without bleeding: Secondary | ICD-10-CM | POA: Diagnosis not present

## 2017-03-21 DIAGNOSIS — K228 Other specified diseases of esophagus: Secondary | ICD-10-CM | POA: Diagnosis not present

## 2017-03-21 DIAGNOSIS — M8580 Other specified disorders of bone density and structure, unspecified site: Secondary | ICD-10-CM | POA: Insufficient documentation

## 2017-03-21 DIAGNOSIS — K210 Gastro-esophageal reflux disease with esophagitis: Secondary | ICD-10-CM | POA: Diagnosis not present

## 2017-03-21 HISTORY — DX: Hyperlipidemia, unspecified: E78.5

## 2017-03-21 HISTORY — DX: Tachycardia, unspecified: R00.0

## 2017-03-21 HISTORY — DX: Personal history of other infectious and parasitic diseases: Z86.19

## 2017-03-21 HISTORY — DX: Essential (primary) hypertension: I10

## 2017-03-21 HISTORY — DX: Polyosteoarthritis, unspecified: M15.9

## 2017-03-21 HISTORY — DX: Subacute thyroiditis: E06.1

## 2017-03-21 HISTORY — DX: Decreased white blood cell count, unspecified: D72.819

## 2017-03-21 HISTORY — DX: Other specified disorders of bone density and structure, unspecified site: M85.80

## 2017-03-21 HISTORY — PX: ESOPHAGOGASTRODUODENOSCOPY (EGD) WITH PROPOFOL: SHX5813

## 2017-03-21 HISTORY — DX: Iron deficiency anemia, unspecified: D50.9

## 2017-03-21 SURGERY — ESOPHAGOGASTRODUODENOSCOPY (EGD) WITH PROPOFOL
Anesthesia: General

## 2017-03-21 MED ORDER — FENTANYL CITRATE (PF) 100 MCG/2ML IJ SOLN
INTRAMUSCULAR | Status: DC | PRN
  Administered 2017-03-21: 50 ug via INTRAVENOUS

## 2017-03-21 MED ORDER — PROPOFOL 500 MG/50ML IV EMUL
INTRAVENOUS | Status: AC
  Filled 2017-03-21: qty 50

## 2017-03-21 MED ORDER — FENTANYL CITRATE (PF) 100 MCG/2ML IJ SOLN
INTRAMUSCULAR | Status: AC
  Filled 2017-03-21: qty 2

## 2017-03-21 MED ORDER — PROPOFOL 500 MG/50ML IV EMUL
INTRAVENOUS | Status: DC | PRN
  Administered 2017-03-21: 100 ug/kg/min via INTRAVENOUS

## 2017-03-21 MED ORDER — SODIUM CHLORIDE 0.9 % IV SOLN
INTRAVENOUS | Status: DC
  Administered 2017-03-21: 1000 mL via INTRAVENOUS

## 2017-03-21 MED ORDER — SODIUM CHLORIDE 0.9 % IV SOLN: INTRAVENOUS | Status: DC

## 2017-03-21 NOTE — Anesthesia Procedure Notes (Signed)
Performed by: COOK-MARTIN, Gabbie Marzo Pre-anesthesia Checklist: Patient identified, Emergency Drugs available, Suction available, Patient being monitored and Timeout performed Patient Re-evaluated:Patient Re-evaluated prior to induction Oxygen Delivery Method: Nasal cannula Preoxygenation: Pre-oxygenation with 100% oxygen Induction Type: IV induction Airway Equipment and Method: Bite block Placement Confirmation: CO2 detector and positive ETCO2       

## 2017-03-21 NOTE — Transfer of Care (Signed)
Immediate Anesthesia Transfer of Care Note  Patient: Sara Smith  Procedure(s) Performed: ESOPHAGOGASTRODUODENOSCOPY (EGD) WITH PROPOFOL (N/A )  Patient Location: PACU  Anesthesia Type:General  Level of Consciousness: awake and sedated  Airway & Oxygen Therapy: Patient Spontanous Breathing and Patient connected to nasal cannula oxygen  Post-op Assessment: Report given to RN and Post -op Vital signs reviewed and stable  Post vital signs: Reviewed and stable  Last Vitals:  Vitals:   03/21/17 1021  BP: (!) 177/109  Pulse: (!) 114  Resp: 16  Temp: (!) 36.1 C  SpO2: 100%    Last Pain: There were no vitals filed for this visit.       Complications: No apparent anesthesia complications

## 2017-03-21 NOTE — H&P (Signed)
Outpatient short stay form Pre-procedure 03/21/2017 12:02 PM Sara Sails MD  Primary Physician: Dr. Ramonita Lab  Reason for visit:  EGD  History of present illness:  Patient is a 81 year old female presenting today as above. She has a recent history of the finding of marked iron deficiency anemia/microcytic. She has had a Hemoccult negative stool on testing and has never had either upper or lower scope. She declines the colonoscopy at this point. She has undergone iron infusions through hematology and her numbers are more stable currently. Her last hemoglobin was approximately 12. She has normal platelets. She does have a history of Helicobacter pylori and was treated with amoxicillin and clarithromycin. She also has abnormal celiac panel. She denies any use of aspirin or NSAIDs. She denies any heartburn issues or dysphagia.    Current Facility-Administered Medications:  .  0.9 %  sodium chloride infusion, , Intravenous, Continuous, Sara Sails, MD, Last Rate: 20 mL/hr at 03/21/17 1037, 1,000 mL at 03/21/17 1037 .  0.9 %  sodium chloride infusion, , Intravenous, Continuous, Sara Sails, MD  Prescriptions Prior to Admission  Medication Sig Dispense Refill Last Dose  . amoxicillin (AMOXIL) 500 MG tablet Take 2 tablets (1,000 mg total) by mouth 2 (two) times daily. 56 tablet 0 Past Week at Unknown time  . azelastine (ASTELIN) 0.1 % nasal spray instill 2 sprays into each nostril twice a day for 7 days then twice a day if needed   Past Week at Unknown time  . B Complex Vitamins (B-COMPLEX/B-12) LIQD Place under the tongue.   03/20/2017 at Unknown time  . calcium-vitamin D (OSCAL WITH D) 250-125 MG-UNIT tablet Take 1 tablet by mouth daily.   03/20/2017 at Unknown time  . clarithromycin (BIAXIN) 500 MG tablet Take 1 tablet (500 mg total) by mouth 2 (two) times daily. 28 tablet 0 Past Week at Unknown time  . ferrous sulfate 325 (65 FE) MG tablet Take 325 mg by mouth daily with  breakfast.   Past Week at Unknown time  . glucosamine-chondroitin 500-400 MG tablet Take 1 tablet by mouth 3 (three) times daily.   Past Week at Unknown time  . Multiple Vitamins-Minerals (GLUCOTEN) CAPS Take by mouth.   Past Week at Unknown time  . Multiple Vitamins-Minerals (MULTIVITAMIN WITH MINERALS) tablet Take by mouth.   Past Week at Unknown time  . pantoprazole (PROTONIX) 40 MG tablet Take by mouth.   Past Week at Unknown time  . pantoprazole (PROTONIX) 40 MG tablet Take 1 tablet (40 mg total) by mouth 2 (two) times daily. 28 tablet 0 03/20/2017 at Unknown time  . tretinoin (RETIN-A) 0.1 % cream Apply topically.   Past Week at Unknown time  . atorvastatin (LIPITOR) 10 MG tablet Take by mouth.   Taking     Allergies  Allergen Reactions  . Actonel [Risedronate Sodium] Other (See Comments)  . Calcitonin ([place]) Other (See Comments)  . Ciprofloxacin Nausea Only  . Risedronate Other (See Comments)    intolerant of secondary to esophagitis     Past Medical History:  Diagnosis Date  . H/O herpes zoster   . History of Helicobacter pylori infection   . Hyperlipidemia   . Hypertension   . Iron deficiency anemia   . Leukopenia   . Osteoarthritis involving multiple joints on both sides of body   . Osteopenia   . Subacute thyroiditis   . Tachycardia     Review of systems:      Physical Exam  Heart and lungs: Regular rate and rhythm without rub or gallop, lungs are bilaterally clear.    HEENT: Normocephalic atraumatic eyes are anicteric    Other:     Pertinant exam for procedure: Soft nontender nondistended bowel sounds positive normoactive    Planned proceedures: EGD and indicated procedures. I have discussed the risks benefits and complications of procedures to include not limited to bleeding, infection, perforation and the risk of sedation and the patient wishes to proceed.    Sara Sails, MD Gastroenterology 03/21/2017  12:02 PM

## 2017-03-21 NOTE — Op Note (Signed)
[facility] Gastroenterology Patient Name: Sara Smith Procedure Date: 03/21/2017 12:03 PM MRN: [id number] Account #: [id number] Date of Birth: [birth year] Admit Type: Outpatient Age: 82 Room: [facility] ENDO ROOM 3 Gender: Female Note Status: Finalized Procedure:            Upper GI endoscopy Indications:          Iron deficiency anemia Providers:            Lollie Sails, MD Referring MD:         Ramonita Lab, MD (Referring MD) Medicines:            Monitored Anesthesia Care Complications:        No immediate complications. Procedure:            Pre-Anesthesia Assessment:                       - ASA Grade Assessment: II - A patient with mild                        systemic disease.                       After obtaining informed consent, the endoscope was                        passed under direct vision. Throughout the procedure,                        the patient's blood pressure, pulse, and oxygen                        saturations were monitored continuously. The Endoscope                        was introduced through the mouth, and advanced to the                        third part of duodenum. The upper GI endoscopy was                        accomplished without difficulty. The patient tolerated                        the procedure well. Findings:      The Z-line was variable. Biopsies were taken with a cold forceps for       histology. Of note, there is a prominant cricopharyngeus.      Diffuse and patchy mild inflammation characterized by congestion (edema)       and erythema was found in the gastric body. Biopsies were taken with a       cold forceps for histology. Biopsies were taken with a cold forceps for       Helicobacter pylori testing.      The cardia and gastric fundus were normal on retroflexion.      The exam of the stomach was otherwise normal.      The examined duodenum was normal. Biopsies were taken with a cold       forceps for  histology. Impression:           - Z-line variable. Biopsied.                       -  Bile gastritis. Biopsied.                       - Normal examined duodenum. Biopsied. Recommendation:       - Discharge patient to home.                       - Await pathology results.                       - Return to GI clinic in 6 months. Procedure Code(s):    --- Professional ---                       [phone], Esophagogastroduodenoscopy, flexible, transoral;                        with biopsy, single or multiple Diagnosis Code(s):    --- Professional ---                       K22.8, Other specified diseases of esophagus                       K29.60, Other gastritis without bleeding                       D50.9, Iron deficiency anemia, unspecified CPT copyright 2016 American Medical Association. All rights reserved. The codes documented in this report are preliminary and upon coder review may  be revised to meet current compliance requirements. Lollie Sails, MD 03/21/2017 12:35:20 PM This report has been signed electronically. Number of Addenda: 0 Note Initiated On: 03/21/2017 12:03 PM      [facility]

## 2017-03-21 NOTE — Anesthesia Preprocedure Evaluation (Signed)
Anesthesia Evaluation  Patient identified by MRN, date of birth, ID band Patient awake    Reviewed: Allergy & Precautions, NPO status , Patient's Chart, lab work & pertinent test results  History of Anesthesia Complications Negative for: history of anesthetic complications  Airway Mallampati: II       Dental   Pulmonary neg sleep apnea, neg COPD, former smoker,           Cardiovascular      Neuro/Psych neg Seizures    GI/Hepatic Neg liver ROS, GERD  Medicated and Controlled,  Endo/Other  neg diabetes  Renal/GU negative Renal ROS     Musculoskeletal   Abdominal   Peds  Hematology  (+) anemia ,   Anesthesia Other Findings   Reproductive/Obstetrics                             Anesthesia Physical Anesthesia Plan  ASA: II  Anesthesia Plan: General   Post-op Pain Management:    Induction: Intravenous  PONV Risk Score and Plan: Propofol infusion  Airway Management Planned: Nasal Cannula  Additional Equipment:   Intra-op Plan:   Post-operative Plan:   Informed Consent: I have reviewed the patients History and Physical, chart, labs and discussed the procedure including the risks, benefits and alternatives for the proposed anesthesia with the patient or authorized representative who has indicated his/her understanding and acceptance.     Plan Discussed with:   Anesthesia Plan Comments:         Anesthesia Quick Evaluation

## 2017-03-21 NOTE — Anesthesia Post-op Follow-up Note (Signed)
Anesthesia QCDR form completed.        

## 2017-03-22 ENCOUNTER — Encounter: Payer: Self-pay | Admitting: Gastroenterology

## 2017-03-22 NOTE — Anesthesia Postprocedure Evaluation (Signed)
Anesthesia Post Note  Patient: Cardelia Sassano  Procedure(s) Performed: ESOPHAGOGASTRODUODENOSCOPY (EGD) WITH PROPOFOL (N/A )  Patient location during evaluation: Endoscopy Anesthesia Type: General Level of consciousness: awake and alert Pain management: pain level controlled Vital Signs Assessment: post-procedure vital signs reviewed and stable Respiratory status: spontaneous breathing, nonlabored ventilation, respiratory function stable and patient connected to nasal cannula oxygen Cardiovascular status: blood pressure returned to baseline and stable Postop Assessment: no apparent nausea or vomiting Anesthetic complications: no     Last Vitals:  Vitals:   03/21/17 1235 03/21/17 1305  BP: (!) 146/63 132/63  Pulse: 82   Resp: 16   Temp: 37.3 C   SpO2: 98%     Last Pain:  Vitals:   03/22/17 0736  TempSrc:   PainSc: 0-No pain                 Precious Haws Piscitello

## 2017-03-23 LAB — SURGICAL PATHOLOGY

## 2017-04-26 DIAGNOSIS — E7849 Other hyperlipidemia: Secondary | ICD-10-CM | POA: Diagnosis not present

## 2017-04-26 DIAGNOSIS — R030 Elevated blood-pressure reading, without diagnosis of hypertension: Secondary | ICD-10-CM | POA: Diagnosis not present

## 2017-04-26 DIAGNOSIS — D649 Anemia, unspecified: Secondary | ICD-10-CM | POA: Diagnosis not present

## 2017-05-04 DIAGNOSIS — D509 Iron deficiency anemia, unspecified: Secondary | ICD-10-CM | POA: Diagnosis not present

## 2017-05-04 DIAGNOSIS — R030 Elevated blood-pressure reading, without diagnosis of hypertension: Secondary | ICD-10-CM | POA: Diagnosis not present

## 2017-05-04 DIAGNOSIS — E7849 Other hyperlipidemia: Secondary | ICD-10-CM | POA: Diagnosis not present

## 2017-05-04 DIAGNOSIS — Z23 Encounter for immunization: Secondary | ICD-10-CM | POA: Diagnosis not present

## 2017-05-04 DIAGNOSIS — Z0000 Encounter for general adult medical examination without abnormal findings: Secondary | ICD-10-CM | POA: Diagnosis not present

## 2017-05-04 DIAGNOSIS — E061 Subacute thyroiditis: Secondary | ICD-10-CM | POA: Diagnosis not present

## 2017-05-04 DIAGNOSIS — M150 Primary generalized (osteo)arthritis: Secondary | ICD-10-CM | POA: Diagnosis not present

## 2017-05-08 ENCOUNTER — Encounter: Payer: Self-pay | Admitting: *Deleted

## 2017-05-09 ENCOUNTER — Encounter: Payer: Self-pay | Admitting: Anesthesiology

## 2017-05-09 ENCOUNTER — Encounter
Admission: RE | Disposition: A | Discharge status: Home / Self Care | Payer: Self-pay | Source: Ambulatory Visit | Attending: Gastroenterology

## 2017-05-09 ENCOUNTER — Ambulatory Visit
Admission: RE | Admit: 2017-05-09 | Discharge: 2017-05-09 | Disposition: A | Discharge status: Home / Self Care | Payer: PPO | Source: Ambulatory Visit | Attending: Gastroenterology | Admitting: Gastroenterology

## 2017-05-09 ENCOUNTER — Ambulatory Visit: Payer: PPO | Admitting: Anesthesiology

## 2017-05-09 DIAGNOSIS — C182 Malignant neoplasm of ascending colon: Secondary | ICD-10-CM | POA: Diagnosis not present

## 2017-05-09 DIAGNOSIS — I10 Essential (primary) hypertension: Secondary | ICD-10-CM | POA: Insufficient documentation

## 2017-05-09 DIAGNOSIS — D509 Iron deficiency anemia, unspecified: Secondary | ICD-10-CM | POA: Diagnosis not present

## 2017-05-09 DIAGNOSIS — M8580 Other specified disorders of bone density and structure, unspecified site: Secondary | ICD-10-CM | POA: Diagnosis not present

## 2017-05-09 DIAGNOSIS — C801 Malignant (primary) neoplasm, unspecified: Secondary | ICD-10-CM

## 2017-05-09 DIAGNOSIS — M1990 Unspecified osteoarthritis, unspecified site: Secondary | ICD-10-CM | POA: Diagnosis not present

## 2017-05-09 DIAGNOSIS — E785 Hyperlipidemia, unspecified: Secondary | ICD-10-CM | POA: Diagnosis not present

## 2017-05-09 DIAGNOSIS — Z888 Allergy status to other drugs, medicaments and biological substances status: Secondary | ICD-10-CM | POA: Diagnosis not present

## 2017-05-09 DIAGNOSIS — K639 Disease of intestine, unspecified: Secondary | ICD-10-CM | POA: Diagnosis not present

## 2017-05-09 DIAGNOSIS — Z881 Allergy status to other antibiotic agents status: Secondary | ICD-10-CM | POA: Diagnosis not present

## 2017-05-09 DIAGNOSIS — K5730 Diverticulosis of large intestine without perforation or abscess without bleeding: Secondary | ICD-10-CM | POA: Diagnosis not present

## 2017-05-09 DIAGNOSIS — Z87891 Personal history of nicotine dependence: Secondary | ICD-10-CM | POA: Insufficient documentation

## 2017-05-09 DIAGNOSIS — Z79899 Other long term (current) drug therapy: Secondary | ICD-10-CM | POA: Insufficient documentation

## 2017-05-09 DIAGNOSIS — K5790 Diverticulosis of intestine, part unspecified, without perforation or abscess without bleeding: Secondary | ICD-10-CM | POA: Diagnosis not present

## 2017-05-09 HISTORY — PX: COLONOSCOPY WITH PROPOFOL: SHX5780

## 2017-05-09 HISTORY — DX: Malignant (primary) neoplasm, unspecified: C80.1

## 2017-05-09 SURGERY — COLONOSCOPY WITH PROPOFOL
Anesthesia: General

## 2017-05-09 MED ORDER — SODIUM CHLORIDE 0.9 % IV SOLN: INTRAVENOUS | Status: DC

## 2017-05-09 MED ORDER — FENTANYL CITRATE (PF) 100 MCG/2ML IJ SOLN
INTRAMUSCULAR | Status: AC
  Filled 2017-05-09: qty 2

## 2017-05-09 MED ORDER — MIDAZOLAM HCL 5 MG/5ML IJ SOLN
INTRAMUSCULAR | Status: DC | PRN
  Administered 2017-05-09 (×2): 1 mg via INTRAVENOUS

## 2017-05-09 MED ORDER — LIDOCAINE HCL (PF) 2 % IJ SOLN
INTRAMUSCULAR | Status: AC
  Filled 2017-05-09: qty 10

## 2017-05-09 MED ORDER — MIDAZOLAM HCL 2 MG/2ML IJ SOLN
INTRAMUSCULAR | Status: AC
  Filled 2017-05-09: qty 2

## 2017-05-09 MED ORDER — LIDOCAINE HCL (PF) 2 % IJ SOLN
INTRAMUSCULAR | Status: DC | PRN
  Administered 2017-05-09: 40 mg

## 2017-05-09 MED ORDER — PROPOFOL 500 MG/50ML IV EMUL
INTRAVENOUS | Status: DC | PRN
  Administered 2017-05-09: 50 ug/kg/min via INTRAVENOUS

## 2017-05-09 MED ORDER — SODIUM CHLORIDE 0.9 % IV SOLN
INTRAVENOUS | Status: DC
  Administered 2017-05-09: 1000 mL via INTRAVENOUS

## 2017-05-09 MED ORDER — SPOT INK MARKER SYRINGE KIT
PACK | SUBMUCOSAL | Status: DC | PRN
  Administered 2017-05-09: 4 mL via SUBMUCOSAL

## 2017-05-09 MED ORDER — PROPOFOL 10 MG/ML IV BOLUS
INTRAVENOUS | Status: AC
  Filled 2017-05-09: qty 20

## 2017-05-09 MED ORDER — FENTANYL CITRATE (PF) 100 MCG/2ML IJ SOLN
INTRAMUSCULAR | Status: DC | PRN
  Administered 2017-05-09: 50 ug via INTRAVENOUS
  Administered 2017-05-09 (×2): 25 ug via INTRAVENOUS

## 2017-05-09 MED ORDER — PROPOFOL 10 MG/ML IV BOLUS
INTRAVENOUS | Status: DC | PRN
  Administered 2017-05-09: 30 mg via INTRAVENOUS
  Administered 2017-05-09: 20 mg via INTRAVENOUS

## 2017-05-09 NOTE — Op Note (Signed)
[facility] Gastroenterology Patient Name: Sara Smith Procedure Date: 05/09/2017 12:05 PM MRN: [id number] Account #: [id number] Date of Birth: 04-Aug-1935 Admit Type: Outpatient Age: 81 Room: [facility] ENDO ROOM 3 Gender: Female Note Status: Finalized Procedure:            Colonoscopy Indications:          Iron deficiency anemia Providers:            Lollie Sails, MD Referring MD:         Ramonita Lab, MD (Referring MD) Medicines:            Monitored Anesthesia Care Complications:        No immediate complications. Procedure:            Pre-Anesthesia Assessment:                       - ASA Grade Assessment: III - A patient with severe                        systemic disease.                       After obtaining informed consent, the colonoscope was                        passed under direct vision. Throughout the procedure,                        the patient's blood pressure, pulse, and oxygen                        saturations were monitored continuously. The                        Colonoscope was introduced through the anus and                        advanced to the the cecum, identified by appendiceal                        orifice and ileocecal valve. The colonoscopy was                        performed without difficulty. The patient tolerated the                        procedure well. The quality of the bowel preparation                        was good. Findings:      A fungating and infiltrative partially obstructing large mass was found       in the mid ascending colon. The mass was circumferential. The mass       measured four cm in length. Oozing was present. Biopsies were taken with       a cold forceps for histology. Area was tattooed with an injection of 4       mL of [place] ink.      Multiple medium-mouthed diverticula were found in the sigmoid colon,       descending colon and transverse colon.      The retroflexed view of  the distal rectum  and anal verge was normal and       showed no anal or rectal abnormalities. Impression:           - Rule out malignancy, partially obstructing tumor in                        the mid ascending colon. Biopsied. Tattooed.                       - Diverticulosis in the sigmoid colon, in the                        descending colon and in the transverse colon. Recommendation:       - Discharge patient to home.                       - Refer to a colo-rectal surgeon at appointment to be                        scheduled. Procedure Code(s):    --- Professional ---                       [phone], Colonoscopy, flexible; with directed submucosal                        injection(s), any substance                       29562, Colonoscopy, flexible; with biopsy, single or                        multiple Diagnosis Code(s):    --- Professional ---                       D49.0, Neoplasm of unspecified behavior of digestive                        system                       D50.9, Iron deficiency anemia, unspecified                       K57.30, Diverticulosis of large intestine without                        perforation or abscess without bleeding CPT copyright 2016 American Medical Association. All rights reserved. The codes documented in this report are preliminary and upon coder review may  be revised to meet current compliance requirements. Lollie Sails, MD 05/09/2017 12:55:44 PM This report has been signed electronically. Number of Addenda: 0 Note Initiated On: 05/09/2017 12:05 PM Scope Withdrawal Time: 0 hours 13 minutes 50 seconds  Total Procedure Duration: 0 hours 26 minutes 2 seconds       [facility]

## 2017-05-09 NOTE — Anesthesia Postprocedure Evaluation (Signed)
Anesthesia Post Note  Patient: Sara Smith  Procedure(s) Performed: COLONOSCOPY WITH PROPOFOL (N/A )  Patient location during evaluation: Endoscopy Anesthesia Type: General Level of consciousness: awake and alert Pain management: pain level controlled Vital Signs Assessment: post-procedure vital signs reviewed and stable Respiratory status: spontaneous breathing, nonlabored ventilation, respiratory function stable and patient connected to nasal cannula oxygen Cardiovascular status: blood pressure returned to baseline and stable Postop Assessment: no apparent nausea or vomiting Anesthetic complications: no     Last Vitals:  Vitals:   05/09/17 1255 05/09/17 1322  BP:  104/75  Pulse:    Resp:    Temp: (!) 36.1 C   SpO2:      Last Pain:  Vitals:   05/09/17 1255  TempSrc: Tympanic                 Kalene Cutler S

## 2017-05-09 NOTE — Anesthesia Preprocedure Evaluation (Signed)
Anesthesia Evaluation  Patient identified by MRN, date of birth, ID band Patient awake    Reviewed: Allergy & Precautions, NPO status , Patient's Chart, lab work & pertinent test results, reviewed documented beta blocker date and time   Airway Mallampati: II  TM Distance: >3 FB     Dental  (+) Chipped   Pulmonary former smoker,           Cardiovascular hypertension, Pt. on medications      Neuro/Psych    GI/Hepatic   Endo/Other    Renal/GU      Musculoskeletal  (+) Arthritis ,   Abdominal   Peds  Hematology  (+) anemia ,   Anesthesia Other Findings   Reproductive/Obstetrics                             Anesthesia Physical Anesthesia Plan  ASA: III  Anesthesia Plan: General   Post-op Pain Management:    Induction: Intravenous  PONV Risk Score and Plan:   Airway Management Planned:   Additional Equipment:   Intra-op Plan:   Post-operative Plan:   Informed Consent: I have reviewed the patients History and Physical, chart, labs and discussed the procedure including the risks, benefits and alternatives for the proposed anesthesia with the patient or authorized representative who has indicated his/her understanding and acceptance.     Plan Discussed with: CRNA  Anesthesia Plan Comments:         Anesthesia Quick Evaluation

## 2017-05-09 NOTE — Transfer of Care (Signed)
Immediate Anesthesia Transfer of Care Note  Patient: Sara Smith  Procedure(s) Performed: COLONOSCOPY WITH PROPOFOL (N/A )  Patient Location: PACU  Anesthesia Type:General  Level of Consciousness: sedated  Airway & Oxygen Therapy: Patient Spontanous Breathing and Patient connected to nasal cannula oxygen  Post-op Assessment: Report given to RN and Post -op Vital signs reviewed and stable  Post vital signs: Reviewed and stable  Last Vitals:  Vitals:   05/09/17 1020  BP: (!) 168/79  Pulse: (!) 103  Resp: 16  Temp: 36.6 C  SpO2: 100%    Last Pain:  Vitals:   05/09/17 1020  TempSrc: Tympanic         Complications: No apparent anesthesia complications

## 2017-05-09 NOTE — H&P (Signed)
Outpatient short stay form Pre-procedure 05/09/2017 12:12 PM Lollie Sails MD  Primary Physician: Dr. Ramonita Lab  Reason for visit:  Colonoscopy  History of present illness:  Patient is a 81 year old female presenting today as above. She has personal history of iron deficiency anemia. She has had an EGD on 03/21/2017 that was negative for bleeding source. She is returning today for colonoscopy for further evaluation as she has continued have iron requirement. She denies any black stool or blood in the stools. She takes no aspirin or blood thinning agent. She is on a proton pump inhibitor daily. She tolerated her prep well.    Current Facility-Administered Medications:  .  0.9 %  sodium chloride infusion, , Intravenous, Continuous, Lollie Sails, MD, Last Rate: 20 mL/hr at 05/09/17 1029, 1,000 mL at 05/09/17 1029 .  0.9 %  sodium chloride infusion, , Intravenous, Continuous, Lollie Sails, MD  Medications Prior to Admission  Medication Sig Dispense Refill Last Dose  . amoxicillin (AMOXIL) 500 MG tablet Take 2 tablets (1,000 mg total) by mouth 2 (two) times daily. 56 tablet 0 Past Week at Unknown time  . azelastine (ASTELIN) 0.1 % nasal spray instill 2 sprays into each nostril twice a day for 7 days then twice a day if needed   Past Week at Unknown time  . B Complex Vitamins (B-COMPLEX/B-12) LIQD Place under the tongue.   Past Week at Unknown time  . calcium-vitamin D (OSCAL WITH D) 250-125 MG-UNIT tablet Take 1 tablet by mouth daily.   Past Week at Unknown time  . clarithromycin (BIAXIN) 500 MG tablet Take 1 tablet (500 mg total) by mouth 2 (two) times daily. 28 tablet 0 Past Week at Unknown time  . ferrous sulfate 325 (65 FE) MG tablet Take 325 mg by mouth daily with breakfast.   Past Week at Unknown time  . glucosamine-chondroitin 500-400 MG tablet Take 1 tablet by mouth 3 (three) times daily.   Past Week at Unknown time  . Multiple Vitamins-Minerals (GLUCOTEN) CAPS Take by  mouth.   Past Week at Unknown time  . Multiple Vitamins-Minerals (MULTIVITAMIN WITH MINERALS) tablet Take by mouth.   Past Week at Unknown time  . pantoprazole (PROTONIX) 40 MG tablet Take by mouth.   Past Week at Unknown time  . pantoprazole (PROTONIX) 40 MG tablet Take 1 tablet (40 mg total) by mouth 2 (two) times daily. 28 tablet 0 Past Week at Unknown time  . tretinoin (RETIN-A) 0.1 % cream Apply topically.   Past Week at Unknown time  . atorvastatin (LIPITOR) 10 MG tablet Take by mouth.   Taking     Allergies  Allergen Reactions  . Actonel [Risedronate Sodium] Other (See Comments)  . Calcitonin ([place]mon) Other (See Comments)  . Ciprofloxacin Nausea Only  . Risedronate Other (See Comments)    intolerant of secondary to esophagitis     Past Medical History:  Diagnosis Date  . H/O herpes zoster   . History of Helicobacter pylori infection   . Hyperlipidemia   . Hypertension   . Iron deficiency anemia   . Leukopenia   . Osteoarthritis involving multiple joints on both sides of body   . Osteopenia   . Subacute thyroiditis   . Tachycardia     Review of systems:      Physical Exam    Heart and lungs: Regular rate and rhythm without rub or gallop, lungs are bilaterally clear.    HEENT: Normocephalic atraumatic eyes are anicteric  Other:     Pertinant exam for procedure: Soft nontender nondistended bowel sounds positive normoactive    Planned proceedures: Colonoscopy and indicated procedures. I have discussed the risks benefits and complications of procedures to include not limited to bleeding, infection, perforation and the risk of sedation and the patient wishes to proceed.    Lollie Sails, MD Gastroenterology 05/09/2017  12:12 PM

## 2017-05-09 NOTE — Anesthesia Post-op Follow-up Note (Signed)
Anesthesia QCDR form completed.        

## 2017-05-10 ENCOUNTER — Encounter: Payer: Self-pay | Admitting: Gastroenterology

## 2017-05-10 ENCOUNTER — Telehealth: Payer: Self-pay | Admitting: *Deleted

## 2017-05-10 ENCOUNTER — Other Ambulatory Visit: Payer: Self-pay | Admitting: Pathology

## 2017-05-10 ENCOUNTER — Encounter: Payer: Self-pay | Admitting: *Deleted

## 2017-05-10 LAB — SURGICAL PATHOLOGY

## 2017-05-10 NOTE — Telephone Encounter (Signed)
Left message for patient to call the office back. Patient needs to see Dr.Byrnett for colon, Dr.Skulskie is the referring physician. Offer patient on 05/17/17 at 8:15am

## 2017-05-17 ENCOUNTER — Other Ambulatory Visit
Admission: RE | Admit: 2017-05-17 | Discharge: 2017-05-17 | Disposition: A | Discharge status: Home / Self Care | Payer: PPO | Source: Ambulatory Visit | Attending: General Surgery | Admitting: General Surgery

## 2017-05-17 ENCOUNTER — Encounter: Payer: Self-pay | Admitting: General Surgery

## 2017-05-17 ENCOUNTER — Ambulatory Visit: Payer: PPO | Admitting: General Surgery

## 2017-05-17 VITALS — BP 146/72 | HR 88 | Resp 11 | Ht 65.0 in | Wt 134.0 lb

## 2017-05-17 DIAGNOSIS — C182 Malignant neoplasm of ascending colon: Secondary | ICD-10-CM | POA: Insufficient documentation

## 2017-05-17 LAB — CBC WITH DIFFERENTIAL/PLATELET
BASOS PCT: 1 %
Basophils Absolute: 0 10*3/uL (ref 0–0.1)
Eosinophils Absolute: 0 10*3/uL (ref 0–0.7)
Eosinophils Relative: 1 %
HEMATOCRIT: 31.2 % — AB (ref 35.0–47.0)
HEMOGLOBIN: 9.8 g/dL — AB (ref 12.0–16.0)
LYMPHS ABS: 1.1 10*3/uL (ref 1.0–3.6)
LYMPHS PCT: 25 %
MCH: 25.6 pg — ABNORMAL LOW (ref 26.0–34.0)
MCHC: 31.3 g/dL — AB (ref 32.0–36.0)
MCV: 81.8 fL (ref 80.0–100.0)
MONO ABS: 0.4 10*3/uL (ref 0.2–0.9)
MONOS PCT: 9 %
NEUTROS ABS: 2.9 10*3/uL (ref 1.4–6.5)
Neutrophils Relative %: 64 %
Platelets: 351 10*3/uL (ref 150–440)
RBC: 3.81 MIL/uL (ref 3.80–5.20)
RDW: 15.9 % — AB (ref 11.5–14.5)
WBC: 4.6 10*3/uL (ref 3.6–11.0)

## 2017-05-17 MED ORDER — METRONIDAZOLE 500 MG PO TABS: ORAL_TABLET | ORAL | 0 refills | Status: DC

## 2017-05-17 MED ORDER — NEOMYCIN SULFATE 500 MG PO TABS: ORAL_TABLET | ORAL | 0 refills | Status: DC

## 2017-05-17 MED ORDER — POLYETHYLENE GLYCOL 3350 17 GM/SCOOP PO POWD: ORAL | 0 refills | Status: DC

## 2017-05-17 NOTE — Progress Notes (Signed)
Patient ID: Sara Smith, female   DOB: 19-Jun-1935, 81 y.o.   MRN: [id number]  Chief Complaint  Patient presents with  . Colonoscopy    HPI Sara Smith is a 81 y.o. female.  Here for post colonoscopy evaluation referred by Dr Donnella Sham. Colonoscopy was 05-09-17, upper endoscopy was 03-21-17. She states Dr Donnella Sham found a colon tumor that was cancerous. Bowels move daily, no bleeding. Denies any abdominal pain. She last seen Dr Janese Banks in July. She is here with her husband, Sara Smith of 79 years.  HPI  Past Medical History:  Diagnosis Date  . Cancer ([place]) 05/09/2017   INVASIVE ADENOCARCINOMA, MODERATELY DIFFERENTIATED, ascending colon  . H/O herpes zoster   . History of Helicobacter pylori infection   . Hyperlipidemia   . Hypertension   . Iron deficiency anemia   . Leukopenia   . Osteoarthritis involving multiple joints on both sides of body   . Osteopenia   . Subacute thyroiditis   . Tachycardia     Past Surgical History:  Procedure Laterality Date  . CHOLECYSTECTOMY  2003   Dr Nicholes Stairs  . COLONOSCOPY WITH PROPOFOL N/A 05/09/2017   Procedure: COLONOSCOPY WITH PROPOFOL;  Surgeon: Lollie Sails, MD;  Location: [facility] ENDOSCOPY;  Service: Endoscopy;  Laterality: N/A;  . ESOPHAGOGASTRODUODENOSCOPY (EGD) WITH PROPOFOL N/A 03/21/2017   Procedure: ESOPHAGOGASTRODUODENOSCOPY (EGD) WITH PROPOFOL;  Surgeon: Lollie Sails, MD;  Location: [facility] ENDOSCOPY;  Service: Endoscopy;  Laterality: N/A;  . hyperlipedemia N/A   . irregular HR      Family History  Problem Relation Age of Onset  . Heart disease Father   . Hypertension Father     Social History Social History   Tobacco Use  . Smoking status: Former Research scientist (life sciences)  . Smokeless tobacco: Never Used  . Tobacco comment: smoked in college  Substance Use Topics  . Alcohol use: Yes    Comment: OCCASIONAL  . Drug use: No    Allergies  Allergen Reactions  . Actonel [Risedronate Sodium] Other (See Comments)   . Calcitonin ([place]) Other (See Comments)  . Ciprofloxacin Nausea Only  . Risedronate Other (See Comments)    intolerant of secondary to esophagitis    Current Outpatient Medications  Medication Sig Dispense Refill  . atorvastatin (LIPITOR) 10 MG tablet Take 10 mg by mouth daily at 6 PM.     . azelastine (ASTELIN) 0.1 % nasal spray instill 2 sprays into each nostril twice a day for 7 days then twice a day if needed    . calcium-vitamin D (OSCAL WITH D) 250-125 MG-UNIT tablet Take 1 tablet by mouth daily.    . Cyanocobalamin (VITAMIN B 12 PO) Take 1 tablet by mouth daily.    Marland Kitchen glucosamine-chondroitin 500-400 MG tablet Take 1 tablet by mouth as needed.     . pantoprazole (PROTONIX) 40 MG tablet Take 1 tablet (40 mg total) by mouth 2 (two) times daily. (Patient taking differently: Take 40 mg by mouth daily. ) 28 tablet 0  . tretinoin (RETIN-A) 0.1 % cream Apply topically.    . metroNIDAZOLE (FLAGYL) 500 MG tablet Take one (1) tablet at 6 pm and one (1) tablet at 11 pm the evening prior to surgery. 2 tablet 0  . neomycin (MYCIFRADIN) 500 MG tablet Take two (2) tablets at 6 pm and two (2) tablets at 11 pm the evening prior to surgery. 4 tablet 0  . polyethylene glycol powder (GLYCOLAX/MIRALAX) powder 255 grams one bottle for bowel prep 255 g  0   No current facility-administered medications for this visit.     Review of Systems Review of Systems  Constitutional: Negative.   Respiratory: Negative.   Cardiovascular: Negative.   Gastrointestinal: Negative for constipation and diarrhea.    Blood pressure (!) 146/72, pulse 88, resp. rate 11, height 5' 5"  (1.651 m), weight 134 lb (60.8 kg), SpO2 99 %.  Physical Exam Physical Exam  Constitutional: She is oriented to person, place, and time. She appears well-developed and well-nourished.  HENT:  Mouth/Throat: Oropharynx is clear and moist.  Eyes: Conjunctivae are normal. No scleral icterus.  Neck: Neck supple.  Cardiovascular: Normal  rate, regular rhythm, normal heart sounds and intact distal pulses.  No lower led edema  Pulmonary/Chest: Effort normal and breath sounds normal.  Abdominal: Soft. Normal appearance and bowel sounds are normal. There is no tenderness.  Lymphadenopathy:    She has no cervical adenopathy.  Neurological: She is alert and oriented to person, place, and time.  Skin: Skin is warm and dry.  Psychiatric: Her behavior is normal.    Data Reviewed Colonoscopy of May 09, 2017 reviewed.  Large mass of the ascending colon noted. DIAGNOSIS:  A. COLON MASS, MID ASCENDING; COLD BIOPSY:  - INVASIVE ADENOCARCINOMA, MODERATELY DIFFERENTIATED.   May 17, 2017 laboratory studies:  CEA 0.0 - 4.7 ng/mL 1.3     WBC 3.6 - 11.0 K/uL 4.6  6.0   RBC 3.80 - 5.20 MIL/uL 3.81  4.03   Hemoglobin 12.0 - 16.0 g/dL 9.8 Abnormally low   12.2   HCT 35.0 - 47.0 % 31.2 Abnormally low   35.9   MCV 80.0 - 100.0 fL 81.8  89.0   MCH 26.0 - 34.0 pg 25.6 Abnormally low   30.3   MCHC 32.0 - 36.0 g/dL 31.3 Abnormally low   34.0   RDW 11.5 - 14.5 % 15.9 Abnormally high   16.8 Abnormally high    Platelets 150 - 440 K/uL 351  293      Assessment    Carcinoma of the ascending colon.    Plan    Indications for surgical resection to control her occult bleeding was discussed.  Risks associated with the surgery including those related to intestinal leakage, blood clots and cardiopulmonary compromise were reviewed.  Will obtain a CT scan to assess for metastatic disease.  Importance of early ambulation reviewed.  About having the procedure completed at [facility].  I offered to discuss with Dr. Gustavo Lah for [facility] referral, although at the end of our discussion she was amenable to proceeding with her surgery here.     The patient has expressed some concerns  HPI, Physical Exam, Assessment and Plan have been scribed under the direction and in the presence of Robert Bellow, MD. Karie Fetch, RN  I have  completed the exam and reviewed the above documentation for accuracy and completeness.  I agree with the above.  Haematologist has been used and any errors in dictation or transcription are unintentional.  Hervey Ard, M.D., F.A.C.S.  Robert Bellow 05/18/2017, 9:30 PM  Patient has been scheduled for a CT abdomen/pelvis with contrast at [facility] for 05-30-17 at 1:30 pm (arrive 1:15 pm). Prep: NPO 4 hours prior and pick up prep kit. Patient verbalizes understanding.   Patient will have the following labs drawn at [facility] today: CBC and CEA.   Surgery date TBA. Patient will be asked to complete a bowel prep with antibiotics. Instructions have been reviewed with the patient today.  Dominga Ferry, CMA

## 2017-05-17 NOTE — Patient Instructions (Addendum)
The patient is aware to call back for any questions or concerns.  CT scan to be done prior to surgery

## 2017-05-18 ENCOUNTER — Telehealth: Payer: Self-pay | Admitting: *Deleted

## 2017-05-18 DIAGNOSIS — C182 Malignant neoplasm of ascending colon: Secondary | ICD-10-CM | POA: Insufficient documentation

## 2017-05-18 LAB — CEA: CEA: 1.3 ng/mL (ref 0.0–4.7)

## 2017-05-18 NOTE — Telephone Encounter (Signed)
Patient was contacted today and notified of surgery plans.   This patient's surgery has been scheduled for 06-02-17 at [facility].   The patient was also notified of pre-admission appointment on 05-25-17 at 11:45 am.   The patient verbalizes understanding.

## 2017-05-18 NOTE — Telephone Encounter (Signed)
Patient called and left a message on the voicemail that she needed to cancel her January 8 follow-up appointment with Dr. Janese Banks.  Patient has an appointment to have a CT on the same day.  Patient is supposed to go to surgery on January 11 and wanted to wait at least a week so that she can see [place] I have made her an appointment for June 13 1028 for labs 11:00 to see the doctor and I will mail her out a new appointment patient agreeable to all above.

## 2017-05-25 ENCOUNTER — Encounter
Admission: RE | Admit: 2017-05-25 | Discharge: 2017-05-25 | Disposition: A | Discharge status: Home / Self Care | Payer: PPO | Source: Ambulatory Visit | Attending: General Surgery | Admitting: General Surgery

## 2017-05-25 ENCOUNTER — Other Ambulatory Visit: Payer: Self-pay

## 2017-05-25 ENCOUNTER — Encounter: Payer: Self-pay | Admitting: *Deleted

## 2017-05-25 DIAGNOSIS — Z01812 Encounter for preprocedural laboratory examination: Secondary | ICD-10-CM | POA: Insufficient documentation

## 2017-05-25 DIAGNOSIS — Z0183 Encounter for blood typing: Secondary | ICD-10-CM | POA: Diagnosis not present

## 2017-05-25 DIAGNOSIS — I447 Left bundle-branch block, unspecified: Secondary | ICD-10-CM | POA: Insufficient documentation

## 2017-05-25 DIAGNOSIS — R9431 Abnormal electrocardiogram [ECG] [EKG]: Secondary | ICD-10-CM | POA: Diagnosis not present

## 2017-05-25 DIAGNOSIS — Z01818 Encounter for other preprocedural examination: Secondary | ICD-10-CM | POA: Insufficient documentation

## 2017-05-25 DIAGNOSIS — I10 Essential (primary) hypertension: Secondary | ICD-10-CM | POA: Diagnosis not present

## 2017-05-25 DIAGNOSIS — C182 Malignant neoplasm of ascending colon: Secondary | ICD-10-CM | POA: Diagnosis not present

## 2017-05-25 HISTORY — DX: Cardiac arrhythmia, unspecified: I49.9

## 2017-05-25 LAB — TYPE AND SCREEN
ABO/RH(D): A POS
ANTIBODY SCREEN: NEGATIVE

## 2017-05-25 LAB — SURGICAL PCR SCREEN
MRSA, PCR: NEGATIVE
Staphylococcus aureus: NEGATIVE

## 2017-05-25 NOTE — Pre-Procedure Instructions (Signed)
Called Dr Andree Elk regarding abnormal EKG, medical clearance requested.  Called and faxed clearance request to Dr Bary Castilla and Dr Caryl Comes.

## 2017-05-25 NOTE — Progress Notes (Signed)
Santiago Glad from the [facility] called the office today to report that patient will need medical clearance from Dr. Ramonita Lab due to an abnormal EKG (left bundle branch block).   Santiago Glad states she will forward Dr. Olin Pia office a copy of medical clearance request as well as send our office a copy.   Patient is presently scheduled for a right colectomy on 06-02-17 at [facility].       Sinus rhythm with occasional premature ventricular complexes Minimal voltage criteria for LVH, may be normal variant Borderline ECG No previous ECGs available I reviewed and concur with this report. Electronically signed JH:ERDEY MD, BERT ([id number]) on 07/14/2016 7:32:11 AM  25-May-2017 13:09:09 [facility]-PAT ROUTINE RECORD Sinus rhythm with Fusion complexes Left bundle branch block Abnormal ECG No previous ECGs available Confirmed by Belinda Fisher [phone]) on 05/25/2017 2:42:15 PM

## 2017-05-25 NOTE — Patient Instructions (Signed)
Your procedure is scheduled on: Friday, June 02, 2017  Report to [facility]  To find out your arrival time please call [phone] between 1PM - 3PM on Thursday, June 01, 2017   Remember: Instructions that are not followed completely may result in serious medical risk, up to and including death, or upon the discretion of your surgeon and anesthesiologist your surgery may need to be rescheduled.     _X__ 1. Do not eat food after midnight the night before your procedure.                 No gum chewing or hard candies. You may drink clear liquids up to 2 hours                 before you are scheduled to arrive for your surgery- DO not drink clear                 liquids within 2 hours of the start of your surgery.                 Clear Liquids include:  water, apple juice without pulp, clear carbohydrate                 drink such as Clearfast of [place],      _X__ 2.  No Alcohol for 24 hours before or after surgery.   _     _ 3.  Do Not Smoke or use e-cigarettes For 24 Hours Prior to Your Surgery.                 Do not use any chewable tobacco products for at least 6 hours prior to                 surgery.  ____  4.  Bring all medications with you on the day of surgery if instructed.   __X__  5.  Notify your doctor if there is any change in your medical condition      (cold, fever, infections).     Do not wear jewelry, make-up, hairpins, clips or nail polish. Do not wear lotions, powders, or perfumes. You may wear deodorant. Do not shave 48 hours prior to surgery. Men may shave face and neck. Do not bring valuables to the hospital.    [facility] is not responsible for any belongings or valuables.  Contacts, dentures or bridgework may not be worn into surgery. Leave your suitcase in the car. After surgery it may be brought to your room. For patients admitted to the hospital, discharge time is determined by your treatment  team.   Patients discharged the day of surgery will not be allowed to drive home.   Please read over the following fact sheets that you were given:              PREPARING FOR SURGERY                           MRSA: STOP THE SPREAD   ____ Take these medicines the morning of surgery with A SIP OF WATER:    1. ASTELLIN NASAL SPRAY IF NEEDED  2. LIPITOR  3. PROTONIX  4.  5.  6.  ____ Fleet Enema (as directed)   __X__ Use CHG Soap as directed  ____ Use inhalers on the day of surgery  ____ Stop metformin 2 days prior to surgery  ____ Take 1/2 of usual insulin dose the night before surgery. No insulin the morning          of surgery.   __X__ Stop Coumadin/Plavix/ASPIRIN AS OF TODAY, May 25, 2017  __X__ Stop Anti-inflammatories AS OF TODAY, May 25, 2017.  THIS INCLUDES IBUPROFEN / MOTRIN / ADVIL / ALEVE / GOODIES POWDER   __X__ Stop supplements until after surgery.  DO NOT TAKE ANY GUMMIES UNTIL AFTER SURGERY ( THIS INCLUDES vITAMIN B12 AND D3)  ____ Bring C-Pap to the hospital.   IF YOU HAVE DISCOMFORTS OR A HEADACHE, PLEASE TAKE ONLY TYLENOL.  FOLLOW THE BOWEL PREP INSTRUCTIONS FROM DR. BYRNETT REGARDING THE FLAGYL AND NEOMYCIN TABLETS. THIS ALSO INCLUDES THE BOWEL PREP ON THE DAY BEFORE SURGERY.  NO SOLID FOODS ON THE DAY BEFORE SURGERY, JUST LIQUIDS.  IF YOU REMEMBER, PLEASE BRING A COPY OF YOUR MEDICAL ADVANCE DIRECTIVES SO WE MAY PLACE A COPY IN YOUR RECORD.

## 2017-05-29 ENCOUNTER — Encounter: Payer: Self-pay | Admitting: Cardiovascular Disease

## 2017-05-29 ENCOUNTER — Ambulatory Visit: Payer: PPO | Admitting: Cardiovascular Disease

## 2017-05-29 VITALS — BP 140/70 | HR 80 | Ht 61.0 in | Wt 135.5 lb

## 2017-05-29 DIAGNOSIS — R000 Tachycardia, unspecified: Secondary | ICD-10-CM | POA: Diagnosis not present

## 2017-05-29 DIAGNOSIS — D649 Anemia, unspecified: Secondary | ICD-10-CM | POA: Diagnosis not present

## 2017-05-29 DIAGNOSIS — C182 Malignant neoplasm of ascending colon: Secondary | ICD-10-CM | POA: Diagnosis not present

## 2017-05-29 DIAGNOSIS — E782 Mixed hyperlipidemia: Secondary | ICD-10-CM

## 2017-05-29 DIAGNOSIS — I447 Left bundle-branch block, unspecified: Secondary | ICD-10-CM

## 2017-05-29 NOTE — Progress Notes (Signed)
Cardiology Office Note  Date:  05/29/2017   ID:  Sara Smith, DOB 24-May-1935, MRN [id number]  PCP:  Adin Hector, MD   Chief Complaint  Patient presents with  . other    Surgical clearance c/o elevated BP. Meds reviewed verbally with pt.    HPI:  Ms. Sara Smith is a 82 year old woman with past medical history of Anemia Hyperlipidemia Tachycardia in sept 2017, took metoprolol  Gastritis on endoscopy Fungating circumferential mass mid ascending colon by colonoscopy Who presents by referral from Dr. Bary Castilla for evaluation of new left bundle branch block on EKG  She reports that she feels well Recent colonoscopy and diagnosis of colon mass requiring resection of mid ascending colon Scheduled this Friday June 02, 2016 per the patient  At baseline she is active, does daily exercises (jumped off the table in the exam room today and demonstrated her exercises) Good aerobic capacity, does all of her ADLs  Denies having any prior chest symptoms except for: One episode of tachycardia September 2017 acute onset heart fast heart rate.  She took metoprolol (which she borrowed from her husband), symptoms resolved and she was able to stop the metoprolol with no further episodes all 07/2016  EKG performed May 25, 2017 preop shows normal sinus rhythm left bundle branch block  EKG personally reviewed by myself on todays visit Shows normal sinus rhythm left bundle branch block rate 80 bpm  Previous EKG February/2018, report does not indicate left bundle branch block Presumably left bundle branch block past year   PMH:   has a past medical history of Cancer ([place]) (05/09/2017), Dysrhythmia, H/O herpes zoster, History of Helicobacter pylori infection, Hyperlipidemia, Hypertension, Iron deficiency anemia, Leukopenia, Osteoarthritis involving multiple joints on both sides of body, Osteopenia, Subacute thyroiditis, and Tachycardia.  PSH:    Past Surgical History:  Procedure  Laterality Date  . CHOLECYSTECTOMY  2003   Dr Nicholes Stairs  . COLONOSCOPY WITH PROPOFOL N/A 05/09/2017   Procedure: COLONOSCOPY WITH PROPOFOL;  Surgeon: Lollie Sails, MD;  Location[facility] END[facility]COPY;  Service: Endoscopy;  Laterality: N/A;  . ESOPHAGOGASTRODUODENOSCOPY (EGD) WITH PROPOFOL N/A 03/21/2017   Procedure: ESOPHAGOGASTRODUODENOSCOPY (EGD) WITH PROPOFOL;  Surgeon: Lollie Sails, MD;  Location: [facility] ENDOSCOPY;  Service: Endoscopy;  Laterality: N/A;  . EYE SURGERY Left    cataract surgery  . hyperlipedemia N/A   . irregular HR      Current Outpatient Medications  Medication Sig Dispense Refill  . azelastine (ASTELIN) 0.1 % nasal spray instill 2 sprays into each nostril twice a day for 7 days then twice a day if needed congestion    . Calcium Carbonate-Vitamin D (CALTRATE 600+D PO) Take 1 tablet by mouth daily.     . Cholecalciferol 10000 units TABS Take 1,000 Units by mouth daily. In the morning    . metroNIDAZOLE (FLAGYL) 500 MG tablet Take one (1) tablet at 6 pm and one (1) tablet at 11 pm the evening prior to surgery. 2 tablet 0  . neomycin (MYCIFRADIN) 500 MG tablet Take two (2) tablets at 6 pm and two (2) tablets at 11 pm the evening prior to surgery. 4 tablet 0  . pantoprazole (PROTONIX) 40 MG tablet Take 1 tablet (40 mg total) by mouth 2 (two) times daily. (Patient taking differently: Take 40 mg by mouth daily. In the morning.) 28 tablet 0  . polyethylene glycol powder (GLYCOLAX/MIRALAX) powder 255 grams one bottle for bowel prep 255 g 0  . tretinoin (RETIN-A) 0.1 % cream  Apply 1 application topically at bedtime as needed (SKIN DISCOLORATION). Uses every other night or so on face due to spot on nose that darkens    . vitamin B-12 (CYANOCOBALAMIN) 500 MCG tablet Take 500 mcg by mouth daily. 1 gummie in the morning    . atorvastatin (LIPITOR) 10 MG tablet Take 10 mg by mouth daily. In the morning     No current facility-administered medications for this visit.       Allergies:   Actonel [risedronate sodium]; Calcitonin (salmon); and Ciprofloxacin   Social History:  The patient  reports that she has quit smoking. she has never used smokeless tobacco. She reports that she drinks alcohol. She reports that she does not use drugs.   Family History:   family history includes Heart attack in her father, paternal uncle, paternal uncle, and paternal uncle; Heart disease in her father; Hypertension in her father.    Review of Systems: Review of Systems  Constitutional: Negative.   Respiratory: Negative.   Cardiovascular: Negative.   Gastrointestinal: Negative.   Musculoskeletal: Negative.   Neurological: Negative.   Psychiatric/Behavioral: Negative.   All other systems reviewed and are negative.    PHYSICAL EXAM: VS:  BP 140/70 (BP Location: Right Arm, Patient Position: Sitting, Cuff Size: Normal)   Pulse 80   Ht 5\' 1"  (1.549 m)   Wt 135 lb 8 oz (61.5 kg)   BMI 25.60 kg/m  , BMI Body mass index is 25.6 kg/m. GEN: Well nourished, well developed, in no acute distress  HEENT: normal  Neck: no JVD, carotid bruits, or masses Cardiac: RRR; no murmurs, rubs, or gallops,no edema  Respiratory:  clear to auscultation bilaterally, normal work of breathing GI: soft, nontender, nondistended, + BS MS: no deformity or atrophy  Skin: warm and dry, no rash Neuro:  Strength and sensation are intact Psych: euthymic mood, full affect    Recent Labs: 05/17/2017: Hemoglobin 9.8; Platelets 351    Lipid Panel No results found for: CHOL, HDL, LDLCALC, TRIG    Wt Readings from Last 3 Encounters:  05/29/17 135 lb 8 oz (61.5 kg)  05/25/17 132 lb (59.9 kg)  05/17/17 134 lb (60.8 kg)       ASSESSMENT AND PLAN:  Preop cardiovascular Colon resection scheduled this Friday, June 02, 2017 Acceptable risk for surgery, no further testing needed  Tachycardia - Plan: EKG 12-Lead Remote history of tachycardia September 2017 per the patient, none since  then  No further workup needed at this time  Left bundle branch block (LBBB) on electrocardiography Asymptomatic finding presumably developed over the past year Unable to see EKG from February 2018 in [facility] system but it does not mention left bundle branch block. She has good exercise tolerance, no indication of underlying ischemia or heart disease No further workup at this time, recommend she proceed with colon resection surgery this Friday  Cancer of ascending colon [facility]) Identified on colonoscopy Scheduled for partial colectomy this Friday with Dr. Bary Castilla  Anemia, unspecified type Numbers dramatically improved over the past year, slight drop from September - December 2018.  Previously on iron pill  Mixed hyperlipidemia Cholesterol is at goal on the current lipid regimen. No changes to the medications were made.  Disposition:   F/U as needed   Total encounter time more than 45 minutes  Greater than 50% was spent in counseling and coordination of care with the patient    Orders Placed This Encounter  Procedures  . EKG 12-Lead  Signed, Esmond Plants, M.D., Ph.D. 05/29/2017  [place], [place]

## 2017-05-29 NOTE — Patient Instructions (Signed)
Medication Instructions:   No medication changes made  Labwork:  No new labs needed  Testing/Procedures:  No further testing at this time   Follow-Up: It was a pleasure seeing you in the office today. Please call us if you have new issues that need to be addressed before your next appt.  [phone]  Your physician wants you to follow-up in:  As needed  If you need a refill on your cardiac medications before your next appointment, please call your pharmacy.     

## 2017-05-30 ENCOUNTER — Ambulatory Visit
Admission: RE | Admit: 2017-05-30 | Discharge: 2017-05-30 | Disposition: A | Discharge status: Home / Self Care | Payer: PPO | Source: Ambulatory Visit | Attending: General Surgery | Admitting: General Surgery

## 2017-05-30 ENCOUNTER — Ambulatory Visit: Admission: RE | Admit: 2017-05-30 | Payer: PPO | Source: Ambulatory Visit

## 2017-05-30 ENCOUNTER — Other Ambulatory Visit: Payer: PPO

## 2017-05-30 ENCOUNTER — Ambulatory Visit: Payer: PPO | Admitting: Oncology

## 2017-05-30 DIAGNOSIS — M1990 Unspecified osteoarthritis, unspecified site: Secondary | ICD-10-CM | POA: Diagnosis present

## 2017-05-30 DIAGNOSIS — K5750 Diverticulosis of both small and large intestine without perforation or abscess without bleeding: Secondary | ICD-10-CM

## 2017-05-30 DIAGNOSIS — I058 Other rheumatic mitral valve diseases: Secondary | ICD-10-CM

## 2017-05-30 DIAGNOSIS — E785 Hyperlipidemia, unspecified: Secondary | ICD-10-CM | POA: Diagnosis present

## 2017-05-30 DIAGNOSIS — C189 Malignant neoplasm of colon, unspecified: Secondary | ICD-10-CM | POA: Diagnosis not present

## 2017-05-30 DIAGNOSIS — C182 Malignant neoplasm of ascending colon: Secondary | ICD-10-CM

## 2017-05-30 DIAGNOSIS — I700 Atherosclerosis of aorta: Secondary | ICD-10-CM

## 2017-05-30 DIAGNOSIS — Z87891 Personal history of nicotine dependence: Secondary | ICD-10-CM | POA: Diagnosis not present

## 2017-05-30 DIAGNOSIS — K5730 Diverticulosis of large intestine without perforation or abscess without bleeding: Secondary | ICD-10-CM | POA: Diagnosis not present

## 2017-05-30 DIAGNOSIS — Z8249 Family history of ischemic heart disease and other diseases of the circulatory system: Secondary | ICD-10-CM | POA: Diagnosis not present

## 2017-05-30 DIAGNOSIS — Z79899 Other long term (current) drug therapy: Secondary | ICD-10-CM | POA: Diagnosis not present

## 2017-05-30 DIAGNOSIS — Z881 Allergy status to other antibiotic agents status: Secondary | ICD-10-CM | POA: Diagnosis not present

## 2017-05-30 DIAGNOSIS — Z888 Allergy status to other drugs, medicaments and biological substances status: Secondary | ICD-10-CM | POA: Diagnosis not present

## 2017-05-30 DIAGNOSIS — I10 Essential (primary) hypertension: Secondary | ICD-10-CM | POA: Diagnosis present

## 2017-05-30 DIAGNOSIS — D259 Leiomyoma of uterus, unspecified: Secondary | ICD-10-CM

## 2017-05-30 DIAGNOSIS — D509 Iron deficiency anemia, unspecified: Secondary | ICD-10-CM | POA: Diagnosis present

## 2017-05-30 DIAGNOSIS — Z9049 Acquired absence of other specified parts of digestive tract: Secondary | ICD-10-CM | POA: Diagnosis not present

## 2017-05-30 MED ORDER — IOPAMIDOL (ISOVUE-300) INJECTION 61%
100.0000 mL | Freq: Once | INTRAVENOUS | Status: AC | PRN
  Administered 2017-05-30: 100 mL via INTRAVENOUS

## 2017-05-30 NOTE — Progress Notes (Signed)
Received call from Dr Donivan Scull office. The patient is clear to precede with surgery scheduled for 06/02/17. This is documented in his notes from 05/29/17. Pre admit has been notified.

## 2017-05-31 ENCOUNTER — Telehealth: Payer: Self-pay | Admitting: *Deleted

## 2017-05-31 NOTE — Telephone Encounter (Signed)
Notified patient as instructed, patient pleased. Discussed follow-up appointments, patient agrees  

## 2017-05-31 NOTE — Telephone Encounter (Signed)
-----   Message from Robert Bellow, MD sent at 05/31/2017  9:35 AM EST ----- Please notify CT OK. Received Dr. Donivan Scull notes. On track for Friday surgery.  ----- Message ----- From: Interface, Rad Results In Sent: 05/31/2017   7:21 AM To: Robert Bellow, MD

## 2017-06-01 MED ORDER — SODIUM CHLORIDE 0.9 % IV SOLN
1.0000 g | INTRAVENOUS | Status: AC
  Administered 2017-06-02: 1 g via INTRAVENOUS
  Filled 2017-06-01: qty 1

## 2017-06-02 ENCOUNTER — Encounter
Admission: RE | Disposition: A | Discharge status: Home / Self Care | Payer: Self-pay | Source: Home / Self Care | Attending: General Surgery

## 2017-06-02 ENCOUNTER — Other Ambulatory Visit: Payer: Self-pay

## 2017-06-02 ENCOUNTER — Inpatient Hospital Stay: Payer: PPO | Admitting: Certified Registered"

## 2017-06-02 ENCOUNTER — Inpatient Hospital Stay
Admission: RE | Admit: 2017-06-02 | Discharge: 2017-06-05 | DRG: 331 | Disposition: A | Discharge status: Home / Self Care | Payer: PPO | Attending: General Surgery | Admitting: General Surgery

## 2017-06-02 DIAGNOSIS — Z881 Allergy status to other antibiotic agents status: Secondary | ICD-10-CM

## 2017-06-02 DIAGNOSIS — Z8249 Family history of ischemic heart disease and other diseases of the circulatory system: Secondary | ICD-10-CM

## 2017-06-02 DIAGNOSIS — Z79899 Other long term (current) drug therapy: Secondary | ICD-10-CM | POA: Diagnosis not present

## 2017-06-02 DIAGNOSIS — E785 Hyperlipidemia, unspecified: Secondary | ICD-10-CM | POA: Diagnosis present

## 2017-06-02 DIAGNOSIS — I10 Essential (primary) hypertension: Secondary | ICD-10-CM | POA: Diagnosis present

## 2017-06-02 DIAGNOSIS — Z9049 Acquired absence of other specified parts of digestive tract: Secondary | ICD-10-CM

## 2017-06-02 DIAGNOSIS — C189 Malignant neoplasm of colon, unspecified: Secondary | ICD-10-CM | POA: Diagnosis present

## 2017-06-02 DIAGNOSIS — D509 Iron deficiency anemia, unspecified: Secondary | ICD-10-CM | POA: Diagnosis present

## 2017-06-02 DIAGNOSIS — Z888 Allergy status to other drugs, medicaments and biological substances status: Secondary | ICD-10-CM | POA: Diagnosis not present

## 2017-06-02 DIAGNOSIS — M1990 Unspecified osteoarthritis, unspecified site: Secondary | ICD-10-CM | POA: Diagnosis present

## 2017-06-02 DIAGNOSIS — Z87891 Personal history of nicotine dependence: Secondary | ICD-10-CM

## 2017-06-02 DIAGNOSIS — C182 Malignant neoplasm of ascending colon: Principal | ICD-10-CM | POA: Diagnosis present

## 2017-06-02 HISTORY — PX: COLON RESECTION: SHX5231

## 2017-06-02 LAB — CREATININE, SERUM: Creatinine, Ser: 0.75 mg/dL (ref 0.44–1.00)

## 2017-06-02 SURGERY — LAPAROSCOPIC RIGHT COLON RESECTION
Anesthesia: General | Laterality: Right

## 2017-06-02 MED ORDER — FENTANYL CITRATE (PF) 100 MCG/2ML IJ SOLN
INTRAMUSCULAR | Status: AC
  Filled 2017-06-02: qty 2

## 2017-06-02 MED ORDER — LIDOCAINE HCL (PF) 2 % IJ SOLN
INTRAMUSCULAR | Status: AC
  Filled 2017-06-02: qty 30

## 2017-06-02 MED ORDER — ONDANSETRON HCL 4 MG/2ML IJ SOLN
INTRAMUSCULAR | Status: AC
Start: 2017-06-02 — End: ?
  Filled 2017-06-02: qty 2

## 2017-06-02 MED ORDER — ACETAMINOPHEN 10 MG/ML IV SOLN
1000.0000 mg | Freq: Four times a day (QID) | INTRAVENOUS | Status: AC
  Administered 2017-06-02 – 2017-06-03 (×4): 1000 mg via INTRAVENOUS
  Filled 2017-06-02 (×4): qty 100

## 2017-06-02 MED ORDER — SUGAMMADEX SODIUM 200 MG/2ML IV SOLN
INTRAVENOUS | Status: AC
  Filled 2017-06-02: qty 6

## 2017-06-02 MED ORDER — ACETAMINOPHEN 10 MG/ML IV SOLN
INTRAVENOUS | Status: AC
  Filled 2017-06-02: qty 100

## 2017-06-02 MED ORDER — PROPOFOL 10 MG/ML IV BOLUS
INTRAVENOUS | Status: AC
  Filled 2017-06-02: qty 20

## 2017-06-02 MED ORDER — ONDANSETRON HCL 4 MG/2ML IJ SOLN
INTRAMUSCULAR | Status: DC | PRN
  Administered 2017-06-02: 4 mg via INTRAVENOUS

## 2017-06-02 MED ORDER — PHENYLEPHRINE HCL 10 MG/ML IJ SOLN
INTRAMUSCULAR | Status: DC | PRN
  Administered 2017-06-02: 100 ug via INTRAVENOUS

## 2017-06-02 MED ORDER — TRAMADOL HCL 50 MG PO TABS
50.0000 mg | ORAL_TABLET | Freq: Four times a day (QID) | ORAL | Status: DC
  Administered 2017-06-02 – 2017-06-03 (×5): 50 mg via ORAL
  Filled 2017-06-02 (×6): qty 1

## 2017-06-02 MED ORDER — PROMETHAZINE HCL 25 MG/ML IJ SOLN
6.2500 mg | INTRAMUSCULAR | Status: DC | PRN
  Administered 2017-06-02: 6.25 mg via INTRAVENOUS
  Filled 2017-06-02: qty 1

## 2017-06-02 MED ORDER — KETOROLAC TROMETHAMINE 30 MG/ML IJ SOLN
INTRAMUSCULAR | Status: DC | PRN
  Administered 2017-06-02: 15 mg via INTRAVENOUS

## 2017-06-02 MED ORDER — FENTANYL CITRATE (PF) 100 MCG/2ML IJ SOLN
25.0000 ug | INTRAMUSCULAR | Status: DC | PRN
  Administered 2017-06-02 (×4): 25 ug via INTRAVENOUS

## 2017-06-02 MED ORDER — ENOXAPARIN SODIUM 40 MG/0.4ML ~~LOC~~ SOLN
40.0000 mg | SUBCUTANEOUS | Status: DC
  Administered 2017-06-03 – 2017-06-05 (×3): 40 mg via SUBCUTANEOUS
  Filled 2017-06-02 (×3): qty 0.4

## 2017-06-02 MED ORDER — LACTATED RINGERS IV SOLN
INTRAVENOUS | Status: DC
  Administered 2017-06-02 – 2017-06-04 (×3): via INTRAVENOUS

## 2017-06-02 MED ORDER — FENTANYL CITRATE (PF) 100 MCG/2ML IJ SOLN
INTRAMUSCULAR | Status: DC | PRN
  Administered 2017-06-02 (×4): 50 ug via INTRAVENOUS

## 2017-06-02 MED ORDER — MORPHINE SULFATE (PF) 2 MG/ML IV SOLN: 2.0000 mg | INTRAVENOUS | Status: DC | PRN

## 2017-06-02 MED ORDER — ATORVASTATIN CALCIUM 10 MG PO TABS
10.0000 mg | ORAL_TABLET | Freq: Every day | ORAL | Status: DC
  Administered 2017-06-03 – 2017-06-05 (×3): 10 mg via ORAL
  Filled 2017-06-02 (×3): qty 1

## 2017-06-02 MED ORDER — ONDANSETRON HCL 4 MG/2ML IJ SOLN: 4.0000 mg | Freq: Once | INTRAMUSCULAR | Status: DC | PRN

## 2017-06-02 MED ORDER — PANTOPRAZOLE SODIUM 40 MG PO TBEC
40.0000 mg | DELAYED_RELEASE_TABLET | Freq: Every day | ORAL | Status: DC
  Administered 2017-06-03 – 2017-06-05 (×3): 40 mg via ORAL
  Filled 2017-06-02 (×3): qty 1

## 2017-06-02 MED ORDER — FENTANYL CITRATE (PF) 250 MCG/5ML IJ SOLN
INTRAMUSCULAR | Status: AC
  Filled 2017-06-02: qty 5

## 2017-06-02 MED ORDER — LIDOCAINE HCL (CARDIAC) 20 MG/ML IV SOLN
INTRAVENOUS | Status: DC | PRN
  Administered 2017-06-02: 100 mg via INTRAVENOUS

## 2017-06-02 MED ORDER — ALVIMOPAN 12 MG PO CAPS
12.0000 mg | ORAL_CAPSULE | Freq: Two times a day (BID) | ORAL | Status: DC
  Administered 2017-06-02 – 2017-06-03 (×3): 12 mg via ORAL
  Filled 2017-06-02 (×4): qty 1

## 2017-06-02 MED ORDER — ONDANSETRON HCL 4 MG/2ML IJ SOLN
INTRAMUSCULAR | Status: AC
  Filled 2017-06-02: qty 4

## 2017-06-02 MED ORDER — SUGAMMADEX SODIUM 200 MG/2ML IV SOLN
INTRAVENOUS | Status: DC | PRN
  Administered 2017-06-02: 250 mg via INTRAVENOUS

## 2017-06-02 MED ORDER — BUPIVACAINE-EPINEPHRINE (PF) 0.5% -1:200000 IJ SOLN
INTRAMUSCULAR | Status: DC | PRN
  Administered 2017-06-02: 30 mL via PERINEURAL

## 2017-06-02 MED ORDER — SUGAMMADEX SODIUM 200 MG/2ML IV SOLN: INTRAVENOUS | Status: DC | PRN

## 2017-06-02 MED ORDER — ROCURONIUM BROMIDE 50 MG/5ML IV SOLN
INTRAVENOUS | Status: AC
  Filled 2017-06-02: qty 2

## 2017-06-02 MED ORDER — AZELASTINE HCL 0.1 % NA SOLN
1.0000 | Freq: Two times a day (BID) | NASAL | Status: DC
  Filled 2017-06-02: qty 30

## 2017-06-02 MED ORDER — FENTANYL CITRATE (PF) 100 MCG/2ML IJ SOLN
INTRAMUSCULAR | Status: AC
  Administered 2017-06-02: 25 ug via INTRAVENOUS
  Filled 2017-06-02: qty 2

## 2017-06-02 MED ORDER — ALVIMOPAN 12 MG PO CAPS
12.0000 mg | ORAL_CAPSULE | ORAL | Status: AC
  Administered 2017-06-02: 12 mg via ORAL

## 2017-06-02 MED ORDER — HYDROMORPHONE HCL 1 MG/ML IJ SOLN
INTRAMUSCULAR | Status: AC
  Filled 2017-06-02: qty 1

## 2017-06-02 MED ORDER — BUPIVACAINE-EPINEPHRINE (PF) 0.5% -1:200000 IJ SOLN
INTRAMUSCULAR | Status: AC
Start: 2017-06-02 — End: ?
  Filled 2017-06-02: qty 30

## 2017-06-02 MED ORDER — PROPOFOL 10 MG/ML IV BOLUS
INTRAVENOUS | Status: DC | PRN
  Administered 2017-06-02: 50 mg via INTRAVENOUS
  Administered 2017-06-02: 100 mg via INTRAVENOUS
  Administered 2017-06-02: 50 mg via INTRAVENOUS

## 2017-06-02 MED ORDER — SUGAMMADEX SODIUM 500 MG/5ML IV SOLN
INTRAVENOUS | Status: AC
  Filled 2017-06-02: qty 5

## 2017-06-02 MED ORDER — OXYCODONE HCL 5 MG PO TABS: 5.0000 mg | ORAL_TABLET | ORAL | Status: DC | PRN

## 2017-06-02 MED ORDER — GLYCOPYRROLATE 0.2 MG/ML IJ SOLN
INTRAMUSCULAR | Status: DC | PRN
  Administered 2017-06-02: 0.2 mg via INTRAVENOUS

## 2017-06-02 MED ORDER — MIDAZOLAM HCL 2 MG/2ML IJ SOLN
INTRAMUSCULAR | Status: AC
  Filled 2017-06-02: qty 2

## 2017-06-02 MED ORDER — LACTATED RINGERS IV SOLN
INTRAVENOUS | Status: DC
  Administered 2017-06-02: 06:00:00 via INTRAVENOUS

## 2017-06-02 MED ORDER — ALVIMOPAN 12 MG PO CAPS
ORAL_CAPSULE | ORAL | Status: AC
  Administered 2017-06-02: 12 mg via ORAL
  Filled 2017-06-02: qty 1

## 2017-06-02 MED ORDER — ROCURONIUM BROMIDE 100 MG/10ML IV SOLN
INTRAVENOUS | Status: DC | PRN
  Administered 2017-06-02 (×4): 10 mg via INTRAVENOUS
  Administered 2017-06-02: 30 mg via INTRAVENOUS

## 2017-06-02 SURGICAL SUPPLY — 76 items
APPLIER CLIP ROT 10 11.4 M/L (STAPLE)
BLADE SURG 10 STRL SS SAFETY (BLADE) ×3 IMPLANT
BLADE SURG 11 STRL SS SAFETY (MISCELLANEOUS) ×3 IMPLANT
BLADE SURG 15 STRL SS SAFETY (BLADE) ×3 IMPLANT
CANISTER SUCT 1200ML W/VALVE (MISCELLANEOUS) ×3 IMPLANT
CANNULA DILATOR 10 W/SLV (CANNULA) ×2 IMPLANT
CANNULA DILATOR 10MM W/SLV (CANNULA) ×1
CHLORAPREP W/TINT 26ML (MISCELLANEOUS) ×3 IMPLANT
CLIP APPLIE ROT 10 11.4 M/L (STAPLE) IMPLANT
CLOSURE WOUND 1/2 X4 (GAUZE/BANDAGES/DRESSINGS) ×1
COVER CLAMP SIL LG PBX B (MISCELLANEOUS) IMPLANT
DEFOGGER SCOPE WARMER CLEARIFY (MISCELLANEOUS) ×3 IMPLANT
DEVICE HAND ACCESS DEXTUS (MISCELLANEOUS) IMPLANT
DRAPE LAP W/FLUID (DRAPES) IMPLANT
DRAPE UNDER BUTTOCK W/FLU (DRAPES) IMPLANT
DRSG OPSITE POSTOP 4X10 (GAUZE/BANDAGES/DRESSINGS) ×3 IMPLANT
DRSG OPSITE POSTOP 4X8 (GAUZE/BANDAGES/DRESSINGS) ×6 IMPLANT
DRSG TEGADERM 2-3/8X2-3/4 SM (GAUZE/BANDAGES/DRESSINGS) ×9 IMPLANT
DRSG TEGADERM 4X4.75 (GAUZE/BANDAGES/DRESSINGS) IMPLANT
DRSG TELFA 3X8 NADH (GAUZE/BANDAGES/DRESSINGS) IMPLANT
ELECT BLADE 6.5 EXT (BLADE) ×3 IMPLANT
ELECT CAUTERY BLADE 6.4 (BLADE) ×3 IMPLANT
ELECT REM PT RETURN 9FT ADLT (ELECTROSURGICAL) ×3
ELECTRODE REM PT RTRN 9FT ADLT (ELECTROSURGICAL) ×1 IMPLANT
FILTER LAP SMOKE EVAC STRL (MISCELLANEOUS) ×3 IMPLANT
GLOVE BIO SURGEON STRL SZ7 (GLOVE) ×9 IMPLANT
GLOVE BIO SURGEON STRL SZ7.5 (GLOVE) ×9 IMPLANT
GLOVE INDICATOR 8.0 STRL GRN (GLOVE) ×6 IMPLANT
GOWN STRL REUS W/ TWL LRG LVL3 (GOWN DISPOSABLE) ×6 IMPLANT
GOWN STRL REUS W/TWL LRG LVL3 (GOWN DISPOSABLE) ×12
HANDLE YANKAUER SUCT BULB TIP (MISCELLANEOUS) ×3 IMPLANT
HOLDER FOLEY CATH W/STRAP (MISCELLANEOUS) ×3 IMPLANT
IRRIGATION STRYKERFLOW (MISCELLANEOUS) IMPLANT
IRRIGATOR STRYKERFLOW (MISCELLANEOUS)
IV LACTATED RINGERS 1000ML (IV SOLUTION) ×3 IMPLANT
KIT PINK PAD W/HEAD ARE REST (MISCELLANEOUS) ×3
KIT PINK PAD W/HEAD ARM REST (MISCELLANEOUS) ×1 IMPLANT
KIT RM TURNOVER STRD PROC AR (KITS) ×3 IMPLANT
LABEL OR SOLS (LABEL) ×3 IMPLANT
LIGASURE LAP MARYLAND 5MM 37CM (ELECTROSURGICAL) IMPLANT
NDL INSUFF ACCESS 14 VERSASTEP (NEEDLE) ×3 IMPLANT
NEEDLE HYPO 22GX1.5 SAFETY (NEEDLE) ×3 IMPLANT
NS IRRIG 500ML POUR BTL (IV SOLUTION) ×3 IMPLANT
PACK COLON CLEAN CLOSURE (MISCELLANEOUS) ×3 IMPLANT
PACK LAP CHOLECYSTECTOMY (MISCELLANEOUS) ×3 IMPLANT
PAD PREP 24X41 OB/GYN DISP (PERSONAL CARE ITEMS) ×3 IMPLANT
PENCIL ELECTRO HAND CTR (MISCELLANEOUS) ×3 IMPLANT
PROT DEXTUS HAND ACCESS (MISCELLANEOUS)
RELOAD PROXIMATE 75MM BLUE (ENDOMECHANICALS) ×3 IMPLANT
RETAINER VISCERA MED (MISCELLANEOUS) IMPLANT
RETRACTOR FIXED LENGTH SML (MISCELLANEOUS) IMPLANT
RETRACTOR WOUND ALXS 18CM MED (MISCELLANEOUS) ×1 IMPLANT
RTRCTR WOUND ALEXIS O 18CM MED (MISCELLANEOUS) ×3
SCISSORS METZENBAUM CVD 33 (INSTRUMENTS) ×3 IMPLANT
SET YANKAUER POOLE SUCT (MISCELLANEOUS) IMPLANT
SHEARS HARMONIC ACE PLUS 36CM (ENDOMECHANICALS) IMPLANT
SPONGE LAP 18X18 5 PK (GAUZE/BANDAGES/DRESSINGS) ×12 IMPLANT
STAPLER PROXIMATE 75MM BLUE (STAPLE) ×3 IMPLANT
STAPLER SKIN PROX 35W (STAPLE) ×3 IMPLANT
STRIP CLOSURE SKIN 1/2X4 (GAUZE/BANDAGES/DRESSINGS) ×2 IMPLANT
SUT MAXON ABS #0 GS21 30IN (SUTURE) ×3 IMPLANT
SUT SILK 2 0 (SUTURE) ×2
SUT SILK 2-0 30XBRD TIE 12 (SUTURE) ×1 IMPLANT
SUT SILK 3-0 (SUTURE) ×3 IMPLANT
SUT VIC AB 2-0 BRD 54 (SUTURE) ×6 IMPLANT
SUT VIC AB 2-0 CT1 27 (SUTURE) ×8
SUT VIC AB 2-0 CT1 TAPERPNT 27 (SUTURE) ×4 IMPLANT
SUT VIC AB 3-0 54X BRD REEL (SUTURE) ×2 IMPLANT
SUT VIC AB 3-0 BRD 54 (SUTURE) ×4
SUT VIC AB 3-0 SH 27 (SUTURE) ×4
SUT VIC AB 3-0 SH 27X BRD (SUTURE) ×2 IMPLANT
SUT VIC AB 4-0 FS2 27 (SUTURE) ×9 IMPLANT
TRAY FOLEY W/METER SILVER 16FR (SET/KITS/TRAYS/PACK) IMPLANT
TROCAR XCEL NON-BLD 11X100MML (ENDOMECHANICALS) ×3 IMPLANT
TROCAR XCEL UNIV SLVE 11M 100M (ENDOMECHANICALS) ×6 IMPLANT
TUBING INSUF HEATED (TUBING) ×3 IMPLANT

## 2017-06-02 NOTE — H&P (Signed)
Nausea and vomiting from prep and antibiotics.  Otherwise, feeling well. For right colectomy.

## 2017-06-02 NOTE — Anesthesia Post-op Follow-up Note (Signed)
Anesthesia QCDR form completed.        

## 2017-06-02 NOTE — Anesthesia Postprocedure Evaluation (Signed)
Anesthesia Post Note  Patient: Sara Smith  Procedure(s) Performed: LAPAROSCOPIC RIGHT COLON RESECTION (Right )  Patient location during evaluation: PACU Anesthesia Type: General Level of consciousness: awake and alert and oriented Pain management: pain level controlled Vital Signs Assessment: post-procedure vital signs reviewed and stable Respiratory status: spontaneous breathing Cardiovascular status: blood pressure returned to baseline Anesthetic complications: no     Last Vitals:  Vitals:   06/02/17 1506 06/02/17 1605  BP: (!) 147/84 (!) 144/52  Pulse: 81 74  Resp: 19   Temp: 36.6 C 36.5 C  SpO2: 97% 99%    Last Pain:  Vitals:   06/02/17 1511  TempSrc:   PainSc: 5                  Mercedes Valeriano

## 2017-06-02 NOTE — Op Note (Signed)
Preoperative diagnosis: Carcinoma of the ascending colon.  Postoperative diagnosis: Same.  Operative procedure: Laparoscopically assisted right hemicolectomy.  Operating Surgeon: Hervey Ard, MD.  Assistant: Arvilla Meres, RNFA  Anesthesia: General endotracheal.  Marcaine 0.5% with 1-200,000 units of epinephrine, 30 cc.  Estimated blood loss: 15 cc.  Clinical note: This 82 year old woman developed a new onset anemia.  Subsequent evaluation showed a tumor in the ascending colon.  Preoperative CT scan showed some normal sized lymph nodes in the area of the cecum, no evidence of metastatic disease.  CEA was normal.  She was admitted for right hemicolectomy.  She underwent a formal bowel prep with antibiotics.  She received Entereg prior to the procedure.  Colbert Ewing was administered prior to the procedure.  SCD stockings for DVT prevention.  Operative note: With the patient under adequate general endotracheal anesthesia she was placed in Trendelenburg position.  A varies needle was placed through a trans-umbilical incision.  After assuring intra-abdominal location with a hanging drop test the abdomen was insufflated with CO2 at 10 mmHg pressure.  A 15 degree scope was used to evaluate the stomach and liver.  No abnormalities noted.  An 11 mm XL port was placed into the epigastrium just to the left of the falciform ligament and in a similar 11 mm XL port was placed in the left lateral abdominal wall.  The LigaSure device was used to take the omentum off of the colon beginning in the mid transverse colon extending around the hepatic flexure.  The white line of Toldt was divided in a similar fashion and the terminal ileum mobilized.  The appendix was unremarkable.  Extensive ink staining from her colonoscopy was evident.  Ink was also evident in a few mesenteric nodes near the base of the mesentery at the origin of the right branch of the middle colic artery.  After mobilizing the colon to the right of  the midline with good visualization of the duodenum the pneumoperitoneum was released.  An 8 cm incision was made above the level of the umbilicus.  Skin was incised sharply and remaining dissection completed with electrocautery.  A medium Alexis wound protector was placed.  The right colon was easily brought into the field.  The mesentery was scored and divided making use of the harmonic scalpel except for the right colic vessel and the middle branch of the right middle colic vessel which were divided between 2-0 silk ties x2.  The small bowel and colon were placed next to each other and anchored into position with interrupted 3-0 silk seromuscular sutures.  A side-to-side functional end-to-end anastomosis was created with 2 firings of the GIA-75 stapler.  The specimen was handed off and the mesenteric defect closed with a running 3-0 Vicryl suture.  Palpation the liver was unremarkable.  No palpably enlarged lymph nodes in the retroperitoneum.  With the sponge tape and instrument count correct the wound protector was removed and the area was redraped and new instruments used for wound closure.  Fascia was approximated with interrupted 0 Maxon figure-of-eight sutures.  The umbilical skin was tacked to the fascia with a single 2-0 Vicryl suture.  2-0 Vicryl running suture was used to close the adipose layer after irrigation with saline.  The skin was closed with a running 4-0 Vicryl subcuticular suture.  Benzoin, Steri-Strips followed by a honeycomb dressing applied.  The patient tolerated the procedure well and was brought to the recovery room in stable condition.

## 2017-06-02 NOTE — Anesthesia Procedure Notes (Signed)
Procedure Name: Intubation Date/Time: 06/02/2017 9:17 AM Performed by: Patience Musca., CRNA Pre-anesthesia Checklist: Patient identified Patient Re-evaluated:Patient Re-evaluated prior to induction Oxygen Delivery Method: Circle system utilized Preoxygenation: Pre-oxygenation with 100% oxygen Induction Type: IV induction Ventilation: Mask ventilation without difficulty Laryngoscope Size: McGraph and 3 Grade View: Grade I Tube type: Oral Tube size: 7.0 mm Number of attempts: 2 Placement Confirmation: ETT inserted through vocal cords under direct vision,  positive ETCO2,  CO2 detector and breath sounds checked- equal and bilateral Secured at: 21 cm Tube secured with: Tape Dental Injury: Teeth and Oropharynx as per pre-operative assessment and Injury to lip  Comments: Small amount of blood from upper lip. Lip lubricated and stopped bleeding quickly.

## 2017-06-02 NOTE — Progress Notes (Signed)
Pt ambulated around nursing station one time, totally a total of 160 ft. Pt wanted to return back to bed to rest. RN expressed to pt that she needs to ambulate again at least one time before bed tonight. Pt verbalized understanding. RN will continue to encourage ambulation.   Sara Smith [employer]

## 2017-06-02 NOTE — Anesthesia Preprocedure Evaluation (Addendum)
Anesthesia Evaluation  Patient identified by MRN, date of birth, ID band Patient awake    Reviewed: Allergy & Precautions, NPO status , Patient's Chart, lab work & pertinent test results, reviewed documented beta blocker date and time   Airway Mallampati: II  TM Distance: >3 FB     Dental  (+) Chipped, Caps   Pulmonary former smoker,    Pulmonary exam normal        Cardiovascular hypertension, Pt. on medications Normal cardiovascular exam+ dysrhythmias      Neuro/Psych negative neurological ROS  negative psych ROS   GI/Hepatic Neg liver ROS,   Endo/Other  negative endocrine ROS  Renal/GU negative Renal ROS  negative genitourinary   Musculoskeletal  (+) Arthritis ,   Abdominal   Peds  Hematology  (+) anemia ,   Anesthesia Other Findings Past Medical History: 05/09/2017: Cancer ([place])     Comment:  INVASIVE ADENOCARCINOMA, MODERATELY DIFFERENTIATED,               ascending colon No date: Dysrhythmia     Comment:  tachycardia occasionally No date: H/O herpes zoster No date: History of Helicobacter pylori infection No date: Hyperlipidemia No date: Hypertension No date: Iron deficiency anemia     Comment:  was on feraheme but stopped due to side effects No date: Leukopenia No date: Osteoarthritis involving multiple joints on both sides of  body No date: Osteopenia No date: Subacute thyroiditis No date: Tachycardia  Reproductive/Obstetrics                            Anesthesia Physical  Anesthesia Plan  ASA: III  Anesthesia Plan: General   Post-op Pain Management:    Induction: Intravenous  PONV Risk Score and Plan:   Airway Management Planned: Oral ETT  Additional Equipment:   Intra-op Plan:   Post-operative Plan: Extubation in OR  Informed Consent: I have reviewed the patients History and Physical, chart, labs and discussed the procedure including the risks, benefits  and alternatives for the proposed anesthesia with the patient or authorized representative who has indicated his/her understanding and acceptance.     Plan Discussed with: CRNA and Surgeon  Anesthesia Plan Comments:         Anesthesia Quick Evaluation

## 2017-06-02 NOTE — Transfer of Care (Signed)
Immediate Anesthesia Transfer of Care Note  Patient: Sara Smith  Procedure(s) Performed: LAPAROSCOPIC RIGHT COLON RESECTION (Right )  Patient Location: PACU  Anesthesia Type:General  Level of Consciousness: awake, alert  and oriented  Airway & Oxygen Therapy: Patient Spontanous Breathing  Post-op Assessment: Report given to RN  Post vital signs: stable  Last Vitals:  Vitals:   06/02/17 0605 06/02/17 1145  BP: (!) 166/79 (!) 153/56  Pulse: 97 91  Resp: 16 17  Temp: (!) 36.3 C 36.9 C  SpO2: 100% 99%    Last Pain:  Vitals:   06/02/17 0605  TempSrc: Tympanic         Complications: No apparent anesthesia complications

## 2017-06-03 ENCOUNTER — Encounter: Payer: Self-pay | Admitting: General Surgery

## 2017-06-03 MED ORDER — ACETAMINOPHEN 325 MG PO TABS
650.0000 mg | ORAL_TABLET | ORAL | Status: DC
  Administered 2017-06-03 – 2017-06-05 (×7): 650 mg via ORAL
  Filled 2017-06-03 (×8): qty 2

## 2017-06-03 NOTE — Progress Notes (Signed)
AVSS. Feeling quite well. Using inspirex at 1000. Minimal pain. Voiding well. Has ambulated. Lungs: Clear. Cardio: RR. ABD: Non-distended,soft. Wound: < 5 cc old blood at right inferior aspect of the honeycomb dressing. Extrem: Soft. Doing well. Plan: Advance diet.

## 2017-06-04 ENCOUNTER — Other Ambulatory Visit: Payer: Self-pay | Admitting: General Surgery

## 2017-06-04 MED ORDER — TRAMADOL HCL 50 MG PO TABS
50.0000 mg | ORAL_TABLET | Freq: Four times a day (QID) | ORAL | Status: DC | PRN
  Administered 2017-06-04: 50 mg via ORAL
  Filled 2017-06-04: qty 1

## 2017-06-04 MED ORDER — SODIUM CHLORIDE 0.9% FLUSH: 3.0000 mL | Freq: Two times a day (BID) | INTRAVENOUS | Status: DC

## 2017-06-04 MED ORDER — SODIUM CHLORIDE 0.9% FLUSH: 3.0000 mL | INTRAVENOUS | Status: DC | PRN

## 2017-06-04 MED ORDER — SODIUM CHLORIDE 0.9 % IV SOLN: 250.0000 mL | INTRAVENOUS | Status: DC | PRN

## 2017-06-04 MED ORDER — AMLODIPINE BESYLATE 5 MG PO TABS
5.0000 mg | ORAL_TABLET | Freq: Every day | ORAL | Status: DC
  Administered 2017-06-04 – 2017-06-05 (×2): 5 mg via ORAL
  Filled 2017-06-04 (×2): qty 1

## 2017-06-04 NOTE — Progress Notes (Signed)
Per MD okay for RN to DC iv fluids.  

## 2017-06-04 NOTE — Progress Notes (Signed)
BP drifting up. No past history of hypertension. Fluid balance negative. Will RX w/ norvasc, 5 mg po.

## 2017-06-04 NOTE — Progress Notes (Signed)
AVSS. Voiding well. Tolerated diet advance to full. "No more green jello". Ready for diet advance. Reports some nausea early am on awakening, resolved spontaneously. No flatus. Ambulating well by patient and staff report. Inspirex at 1100. Lungs: Clear. Cardio: RR ABD: Non-distended, soft. Good BS. Wound: No change. Extrem: Soft. IMP: Doing well. Ultram had been ordered q6h, will change to PRN.  Plan: D/C enetereg as patient using little narcotics. Advance diet.  Tentative d/c home in AM.

## 2017-06-05 MED ORDER — AMLODIPINE BESYLATE 5 MG PO TABS: 5.0000 mg | ORAL_TABLET | Freq: Every day | ORAL | 0 refills | Status: AC

## 2017-06-05 MED ORDER — HYDROCODONE-ACETAMINOPHEN 5-325 MG PO TABS: 1.0000 | ORAL_TABLET | ORAL | 0 refills | Status: DC | PRN

## 2017-06-05 NOTE — Care Management (Signed)
Patient status post Laparoscopically assisted right hemicolectomy.  No dressing changed needed.  Per nursing independent of all ADL's.  No RNCM needs identified.

## 2017-06-05 NOTE — Final Progress Note (Signed)
Afebrile. BP up yesterday, responded to Norvasc, 5 mg.  Mild tachycardia this AM without symptoms. Dressed for home on my arrival.  + BM. Lungs: Clear. Cardio: RR. ABD: Scaphoid, soft. Wound: Clean and dry. Extrem: Soft. Discussed BP w/ Dr. Caryl Comes (PCP). Will send home on Norvasc 5 mg daily.

## 2017-06-05 NOTE — Care Management Important Message (Signed)
Important Message  Patient Details  Name: Sara Smith MRN: [id number] Date of Birth: 1935/10/15   Medicare Important Message Given:  Yes    Beverly Sessions, RN 06/05/2017, 11:36 AM

## 2017-06-05 NOTE — Progress Notes (Signed)
Pt discharged per MD order. IV removed. Prescription given to pt. Discharge instructions reviewed with pt. Questions answered to pt satisfaction. Pt taken to car in wheelchair by volunteer.

## 2017-06-06 ENCOUNTER — Telehealth: Payer: Self-pay | Admitting: General Surgery

## 2017-06-06 ENCOUNTER — Other Ambulatory Visit: Payer: Self-pay | Admitting: Pathology

## 2017-06-06 NOTE — Telephone Encounter (Signed)
The patient spoke to the on-call physician last night reporting that she had seen some blood with her bowel movement.  Otherwise she is felt well.  She had several additional bowel movements today, but reports that the last bowel movement had just a trace bit of dark blood.  She is not experiencing any pain.  Is tolerating her diet well.  She was notified that the pathology showed 1/36 lymph nodes positive for tumor.  Radial margin fluoroscopically negative but less than 1 mm.  She question whether adjuvant treatment would be recommended.  Her excellent physiologic status may put her into a group for home additional adjuvant chemotherapy is recommended.  Her graph she has upcoming appointments both medical oncology and myself on January 22.  Her case is being reviewed at the [facility] tumor board on January 17.  We will keep her abreast of recommendations.

## 2017-06-09 LAB — SURGICAL PATHOLOGY

## 2017-06-12 NOTE — Progress Notes (Signed)
Hematology/Oncology Consult note [facility]  Telephone:([phone] [phone]) [phone]  Patient Care Team: Adin Hector, MD as PCP - General (Internal Medicine)   Name of the patient: Sara Smith  [id number]  17-Dec-1935   Date of visit: 06/12/17  Diagnosis- 1. Newly diagnosed Stage III colon cancer 2. Iron deficiency anemia  Chief complaint/ Reason for visit- discuss pathology results and further management  Heme/Onc history: Patient is a 82 year old female who was initially seen by me in May 2018.  She was referred to me for her microcytic anemia which was found on her labs in April 2018 when her hemoglobin dropped down to 7 from a baseline of 12- 6 months prior.  Prior to that she has never had an EGD or colonoscopy in the past.  Stool occult cards checked by PCP Dr. Caryl Comes were negative.  Labs were consistent with iron deficiency anemia and patient received 2 doses of Feraheme.  She was also found to be positive for H pylori and was treated with triple antibiotic therapy followed by repeat stool test which was negative.  Celiac disease panel also revealed an elevated TTG.  Urinalysis was negative for hematuria.  She was also referred to [facility] GI (after she did not want to see [facility]) during her initial visit.   With regards to her iron deficiency anemia: Her hemoglobin improved to 10 after 2 doses of Feraheme.  She continued to have iron deficiency but did not wish to take IV iron and continue to take oral iron.  Hemoglobin in October 2018 rose to 12 with continued iron deficiency noted on iron studies.  Patient again did not wish to take IV iron and stayed on oral iron  With regards to her GI workup-she was referred to GI in May 2018 and seen by them in June 2018. Upper endoscopy was scheduled in October 2018.  She was supposed to undergo both EGD and colonoscopy.  However patient complained of constipation due to iron pills and her  colonoscopy was canceled in October 2018 and rescheduled to April 2019.  Upper endoscopy was unremarkable and 2 biopsies were taken from the duodenum for celiac disease which were negative  Her hemoglobin then dropped from 12 to 9.9 when checked by Dr. Caryl Comes in December 2018 and a request was made to move her colonoscopy up.  She therefore underwent colonoscopy on 05/09/17 which showed a fungating and infiltrative partially obstructing large mass in the mid ascending colon measuring about 4 cm.  Pathology showed invasive adenocarcinoma.  Moderately differentiated  Patient underwent right hemicolectomy on 06/02/2017 which showed invasive adenocarcinoma 3.1 cm, grade 2.  Tumor invades through the muscularis propria into pericolonic colorectal tissue.  Proximal and distal margins were negative but radial margin was positive.  LV I was present.  PNI not identified.  2 tumor deposits were present.  1 out of 36 lymph nodes was positive for metastatic disease.  Pathological stage pT3 pN1a. MSI stable  Interval history-patient is doing well since her surgery.  Denies any blood in her stools.  Denies any fatigue  ECOG PS- 0 Pain scale- 0   Review of systems- Review of Systems  Constitutional: Negative for chills, fever, malaise/fatigue and weight loss.  HENT: Negative for congestion, ear discharge and nosebleeds.   Eyes: Negative for blurred vision.  Respiratory: Negative for cough, hemoptysis, sputum production, shortness of breath and wheezing.   Cardiovascular: Negative for chest pain, palpitations, orthopnea and claudication.  Gastrointestinal:  Negative for abdominal pain, blood in stool, constipation, diarrhea, heartburn, melena, nausea and vomiting.  Genitourinary: Negative for dysuria, flank pain, frequency, hematuria and urgency.  Musculoskeletal: Negative for back pain, joint pain and myalgias.  Skin: Negative for rash.  Neurological: Negative for dizziness, tingling, focal weakness, seizures,  weakness and headaches.  Endo/Heme/Allergies: Does not bruise/bleed easily.  Psychiatric/Behavioral: Negative for depression and suicidal ideas. The patient does not have insomnia.       Allergies  Allergen Reactions  . Actonel [Risedronate Sodium] Nausea And Vomiting    Dizziness. Given 10 years ago  . Calcitonin ([place]) Nausea And Vomiting    dizziness  . Ciprofloxacin Nausea Only    Very sick within 2 days of taking antibiotic     Past Medical History:  Diagnosis Date  . Cancer ([place]) 05/09/2017   INVASIVE ADENOCARCINOMA, MODERATELY DIFFERENTIATED, ascending colon  . Dysrhythmia    tachycardia occasionally  . H/O herpes zoster   . History of Helicobacter pylori infection   . Hyperlipidemia   . Hypertension   . Iron deficiency anemia    was on feraheme but stopped due to side effects  . Leukopenia   . Osteoarthritis involving multiple joints on both sides of body   . Osteopenia   . Subacute thyroiditis   . Tachycardia      Past Surgical History:  Procedure Laterality Date  . CHOLECYSTECTOMY  2003   Dr Nicholes Stairs  . COLON RESECTION Right 06/02/2017   Procedure: LAPAROSCOPIC RIGHT COLON RESECTION;  Surgeon: Robert Bellow, MD;  Location: [facility] ORS;  Service: General;  Laterality: Right;  . COLONOSCOPY WITH PROPOFOL N/A 05/09/2017   Procedure: COLONOSCOPY WITH PROPOFOL;  Surgeon: Lollie Sails, MD;  Location: [facility] ENDOSCOPY;  Service: Endoscopy;  Laterality: N/A;  . ESOPHAGOGASTRODUODENOSCOPY (EGD) WITH PROPOFOL N/A 03/21/2017   Procedure: ESOPHAGOGASTRODUODENOSCOPY (EGD) WITH PROPOFOL;  Surgeon: Lollie Sails, MD;  Location: [facility] ENDOSCOPY;  Service: Endoscopy;  Laterality: N/A;  . EYE SURGERY Left    cataract surgery  . hyperlipedemia N/A   . irregular HR      Social History   Socioeconomic History  . Marital status: Married    Spouse name: Not on file  . Number of children: Not on file  . Years of education: Not on file  . Highest education  level: Not on file  Social Needs  . Financial resource strain: Not on file  . Food insecurity - worry: Not on file  . Food insecurity - inability: Not on file  . Transportation needs - medical: Not on file  . Transportation needs - non-medical: Not on file  Occupational History  . Not on file  Tobacco Use  . Smoking status: Former Research scientist (life sciences)  . Smokeless tobacco: Never Used  . Tobacco comment: smoked in college  Substance and Sexual Activity  . Alcohol use: Yes    Comment: OCCASIONAL  . Drug use: No  . Sexual activity: No  Other Topics Concern  . Not on file  Social History Narrative  . Not on file    Family History  Problem Relation Age of Onset  . Heart disease Father   . Hypertension Father   . Heart attack Father   . Heart attack Paternal Uncle   . Heart attack Paternal Uncle   . Heart attack Paternal Uncle      Current Outpatient Medications:  .  amLODipine (NORVASC) 5 MG tablet, Take 1 tablet (5 mg total) by mouth daily., Disp: 30 tablet, Rfl:  0 .  atorvastatin (LIPITOR) 10 MG tablet, Take 10 mg by mouth daily. In the morning, Disp: , Rfl:  .  azelastine (ASTELIN) 0.1 % nasal spray, instill 2 sprays into each nostril twice a day for 7 days then twice a day if needed congestion, Disp: , Rfl:  .  Calcium Carbonate-Vitamin D (CALTRATE 600+D PO), Take 1 tablet by mouth daily. , Disp: , Rfl:  .  Cholecalciferol 10000 units TABS, Take 1,000 Units by mouth daily. In the morning, Disp: , Rfl:  .  HYDROcodone-acetaminophen (NORCO/VICODIN) 5-325 MG tablet, Take 1 tablet by mouth every 4 (four) hours as needed for moderate pain., Disp: 12 tablet, Rfl: 0 .  metroNIDAZOLE (FLAGYL) 500 MG tablet, Take one (1) tablet at 6 pm and one (1) tablet at 11 pm the evening prior to surgery., Disp: 2 tablet, Rfl: 0 .  neomycin (MYCIFRADIN) 500 MG tablet, Take two (2) tablets at 6 pm and two (2) tablets at 11 pm the evening prior to surgery., Disp: 4 tablet, Rfl: 0 .  pantoprazole (PROTONIX) 40  MG tablet, Take 1 tablet (40 mg total) by mouth 2 (two) times daily. (Patient taking differently: Take 40 mg by mouth daily. In the morning.), Disp: 28 tablet, Rfl: 0 .  polyethylene glycol powder (GLYCOLAX/MIRALAX) powder, 255 grams one bottle for bowel prep, Disp: 255 g, Rfl: 0 .  tretinoin (RETIN-A) 0.1 % cream, Apply 1 application topically at bedtime as needed (SKIN DISCOLORATION). Uses every other night or so on face due to spot on nose that darkens, Disp: , Rfl:  .  vitamin B-12 (CYANOCOBALAMIN) 500 MCG tablet, Take 500 mcg by mouth daily. 1 gummie in the morning, Disp: , Rfl:   Physical exam:  Vitals:   06/13/17 1043 06/13/17 1044  BP: (!) 150/87   Pulse:  (!) 112  Resp: 20   Temp: (!) 97.3 F (36.3 C)   TempSrc: Tympanic   SpO2:  100%  Weight: 130 lb (59 kg)    Physical Exam  Constitutional: She is oriented to person, place, and time and well-developed, well-nourished, and in no distress.  HENT:  Head: Normocephalic and atraumatic.  Eyes: EOM are normal. Pupils are equal, round, and reactive to light.  Neck: Normal range of motion.  Cardiovascular: Normal rate, regular rhythm and normal heart sounds.  Pulmonary/Chest: Effort normal and breath sounds normal.  Abdominal: Soft. Bowel sounds are normal.  Surgical laparoscopy scar has healed well  Neurological: She is alert and oriented to person, place, and time.  Skin: Skin is warm and dry.     CMP Latest Ref Rng & Units 06/02/2017  Creatinine 0.44 - 1.00 mg/dL 0.75   CBC Latest Ref Rng & Units 06/13/2017  WBC 3.6 - 11.0 K/uL 5.6  Hemoglobin 12.0 - 16.0 g/dL 8.8(L)  Hematocrit 35.0 - 47.0 % 28.5(L)  Platelets 150 - 440 K/uL 588(H)    No images are attached to the encounter.  Ct Abdomen Pelvis W Contrast  Result Date: 05/31/2017 CLINICAL DATA:  Ascending colon cancer staging. EXAM: CT ABDOMEN AND PELVIS WITH CONTRAST TECHNIQUE: Multidetector CT imaging of the abdomen and pelvis was performed using the standard protocol  following bolus administration of intravenous contrast. CONTRAST:  132m ISOVUE-300 IOPAMIDOL (ISOVUE-300) INJECTION 61% COMPARISON:  Report from abdominal ultrasound dated 09/14/2001 FINDINGS: Lower chest: Linear subsegmental atelectasis or scarring in the right middle lobe and in the left lower lobe. Descending thoracic aortic atherosclerotic calcification. Mild calcification of the mitral valve. Pectus excavatum. Hepatobiliary: Cholecystectomy.  Minimal intrahepatic biliary dilatation with the common bile duct measuring about 7 mm in diameter. No appreciable hepatic mass. Pancreas: Unremarkable Spleen: Unremarkable Adrenals/Urinary Tract: Unremarkable Stomach/Bowel: There is some localized wall thickening in the ascending colon just above the ileocecal valve which could represent a region of known tumor. This is shown for example on image 33/5. Several small regional paracolic lymph nodes such as the 6 mm nearby lymph node on image 49/2. Sigmoid colon diverticulosis. Periampullary duodenal diverticulum on image 25/2. Vascular/Lymphatic: Aortoiliac atherosclerotic vascular disease. No pathologic retroperitoneal or porta hepatis adenopathy. Reproductive: Multiple calcified uterine fibroids measuring up to 2.1 cm in diameter. Mildly retroverted uterus. Adnexa unremarkable. Other: No supplemental non-categorized findings. Musculoskeletal: Grade 1 degenerative anterolisthesis at L4-5 with impingement observed at all levels between L2 and S1 primarily due to spondylosis and degenerative disc disease. IMPRESSION: 1. There is some localized wall thickening in the ascending colon likely corresponding to the region of the described mass. Several tiny paracolic lymph nodes in this vicinity are nonspecific with regard to malignant involvement. No findings of metastatic disease to the liver or elsewhere in the abdomen. 2. Minimal intrahepatic biliary dilatation, nonspecific but potentially a physiologic response to  cholecystectomy. 3. Other imaging findings of potential clinical significance: Aortoiliac atherosclerotic vascular disease. Multilevel lumbar impingement. Calcified uterine fibroids. Sigmoid colon diverticulosis. Periampullary duodenal diverticulum. Mild mitral valve calcification. Electronically Signed   By: Van Clines M.D.   On: 05/31/2017 07:18     Assessment and plan- Patient is a 82 y.o. female with newly diagnosed adenocarcinoma of the ascending colon at least stage IIIB p T3 pN1a cMx status post right hemicolectomy  1. Colon cancer-preoperative CEA was 1.3.  She will need a CT thorax with contrast to rule out metastatic disease in the chest.  If there is no evidence of metastatic disease and patient has stage III colon cancer and I would recommend adjuvant chemotherapy with XELOX for 3 months versus FOLFOX for 6 months.    Patient is very active and has no baseline neuropathy. If she were to develop any signs of neuropathy, I will stop oxaliplatin and either continue xeloda alone for 3 months or switch to 5FU alone for 3-6 months. oxaliplatin has little benefit in the elderly with Stage III colon cancer. Treatment will be given with a curative intent pending CT chest results  Discussed risks and benefits of Xeloda as well as oxaliplatin including all but not limited to nausea, vomiting, low blood counts, risk of infections.  Risk of skin rash and hand-foot syndrome associated with Xeloda as well as diarrhea.  Risk of peripheral neuropathy associated with oxaliplatin.  Patient understands and agrees to proceed as planned.  Although XELOX is given only for 3 months-the oxaliplatin dose is 130 mg/m every 3 weeks which can cause significant peripheral neuropathy.  Also rash and diarrhea from Xeloda can be a problem especially in the elderly  Discussed doing 6 months of FOLFOX as a reasonable alternative.  Discussed risks and benefits of 5-FU including all but not limited to nausea, vomiting,  low blood counts, risk of infections and hospitalizations and possible hair loss.  Patient is hesitant to do chemotherapy and would like to take her time to decide if she would like to proceed with chemotherapy or not.  I will tentatively see her in 2 weeks time and discuss this further and hopefully she would have made her decision by then.  Discussed that in order to get maximum benefit of chemotherapy it should be  started within 12 weeks of surgery  We also discussed the patient's case at tumor board and given the positive radial margin I did ask Dr. Donella Stade if there would be role for adjuvant radiation treatment upon completion of chemotherapy.  Dr. Donella Stade did opine that he may be able to give radiation to her right paracolic gutter.  I will make that referral to him when she is nearing end of chemotherapy  2. Iron deficiency anemia-patient is significantly anemic and iron deficient.  We will proceed with 2 doses of Feraheme starting today   Visit Diagnosis 1. Cancer of ascending colon ([place])   2. Goals of care, counseling/discussion   3. Iron deficiency anemia due to chronic blood loss      Dr. Randa Evens, MD, MPH [facility] Pager- [phone] 06/12/2017

## 2017-06-13 ENCOUNTER — Ambulatory Visit (INDEPENDENT_AMBULATORY_CARE_PROVIDER_SITE_OTHER): Payer: PPO | Admitting: General Surgery

## 2017-06-13 ENCOUNTER — Encounter: Payer: Self-pay | Admitting: Oncology

## 2017-06-13 ENCOUNTER — Encounter: Payer: Self-pay | Admitting: General Surgery

## 2017-06-13 ENCOUNTER — Inpatient Hospital Stay: Payer: PPO

## 2017-06-13 ENCOUNTER — Inpatient Hospital Stay: Payer: PPO | Attending: Oncology | Admitting: Oncology

## 2017-06-13 VITALS — BP 150/87 | HR 112 | Temp 97.3°F | Resp 20 | Wt 130.0 lb

## 2017-06-13 VITALS — BP 118/70 | HR 115 | Resp 13 | Ht 61.0 in | Wt 130.0 lb

## 2017-06-13 DIAGNOSIS — C182 Malignant neoplasm of ascending colon: Secondary | ICD-10-CM | POA: Insufficient documentation

## 2017-06-13 DIAGNOSIS — D500 Iron deficiency anemia secondary to blood loss (chronic): Secondary | ICD-10-CM

## 2017-06-13 DIAGNOSIS — D509 Iron deficiency anemia, unspecified: Secondary | ICD-10-CM

## 2017-06-13 DIAGNOSIS — Z7189 Other specified counseling: Secondary | ICD-10-CM

## 2017-06-13 LAB — CBC
HEMATOCRIT: 28.5 % — AB (ref 35.0–47.0)
HEMOGLOBIN: 8.8 g/dL — AB (ref 12.0–16.0)
MCH: 23.9 pg — AB (ref 26.0–34.0)
MCHC: 31 g/dL — AB (ref 32.0–36.0)
MCV: 77 fL — AB (ref 80.0–100.0)
PLATELETS: 588 10*3/uL — AB (ref 150–440)
RBC: 3.7 MIL/uL — ABNORMAL LOW (ref 3.80–5.20)
RDW: 17.5 % — AB (ref 11.5–14.5)
WBC: 5.6 10*3/uL (ref 3.6–11.0)

## 2017-06-13 LAB — IRON AND TIBC
IRON: 13 ug/dL — AB (ref 28–170)
Saturation Ratios: 3 % — ABNORMAL LOW (ref 10.4–31.8)
TIBC: 506 ug/dL — ABNORMAL HIGH (ref 250–450)
UIBC: 493 ug/dL

## 2017-06-13 LAB — FERRITIN: Ferritin: 7 ng/mL — ABNORMAL LOW (ref 11–307)

## 2017-06-13 MED ORDER — SODIUM CHLORIDE 0.9 % IV SOLN
Freq: Once | INTRAVENOUS | Status: AC
  Administered 2017-06-13: 13:00:00 via INTRAVENOUS
  Filled 2017-06-13: qty 1000

## 2017-06-13 MED ORDER — FERUMOXYTOL INJECTION 510 MG/17 ML
510.0000 mg | Freq: Once | INTRAVENOUS | Status: AC
  Administered 2017-06-13: 510 mg via INTRAVENOUS
  Filled 2017-06-13: qty 17

## 2017-06-13 NOTE — Progress Notes (Signed)
Patient ID: Sara Smith, female   DOB: 1935-07-01, 82 y.o.   MRN: [id number]  Chief Complaint  Patient presents with  . Routine Post Op    HPI Sara Smith is a 82 y.o. female here today for her post op colon resection done on 06/02/2017. Patient states she is doing well. Moving her bowels 2-3 x/ day.   HPI  Past Medical History:  Diagnosis Date  . Cancer ([place]) 05/09/2017   INVASIVE ADENOCARCINOMA, MODERATELY DIFFERENTIATED, ascending colon  . Dysrhythmia    tachycardia occasionally  . H/O herpes zoster   . History of Helicobacter pylori infection   . Hyperlipidemia   . Hypertension   . Iron deficiency anemia    was on feraheme but stopped due to side effects  . Leukopenia   . Osteoarthritis involving multiple joints on both sides of body   . Osteopenia   . Subacute thyroiditis   . Tachycardia     Past Surgical History:  Procedure Laterality Date  . CHOLECYSTECTOMY  2003   Dr Nicholes Stairs  . COLON RESECTION Right 06/02/2017   Procedure: LAPAROSCOPIC RIGHT COLON RESECTION;  Surgeon: Robert Bellow, MD;  Location: [facility] ORS;  Service: General;  Laterality: Right;  . COLONOSCOPY WITH PROPOFOL N/A 05/09/2017   Procedure: COLONOSCOPY WITH PROPOFOL;  Surgeon: Lollie Sails, MD;  Location: [facility] ENDOSCOPY;  Service: Endoscopy;  Laterality: N/A;  . ESOPHAGOGASTRODUODENOSCOPY (EGD) WITH PROPOFOL N/A 03/21/2017   Procedure: ESOPHAGOGASTRODUODENOSCOPY (EGD) WITH PROPOFOL;  Surgeon: Lollie Sails, MD;  Location: [facility] ENDOSCOPY;  Service: Endoscopy;  Laterality: N/A;  . EYE SURGERY Left    cataract surgery  . hyperlipedemia N/A   . irregular HR      Family History  Problem Relation Age of Onset  . Heart disease Father   . Hypertension Father   . Heart attack Father   . Heart attack Paternal Uncle   . Heart attack Paternal Uncle   . Heart attack Paternal Uncle     Social History Social History   Tobacco Use  . Smoking status: Former Research scientist (life sciences)  .  Smokeless tobacco: Never Used  . Tobacco comment: smoked in college  Substance Use Topics  . Alcohol use: Yes    Comment: OCCASIONAL  . Drug use: No    Allergies  Allergen Reactions  . Actonel [Risedronate Sodium] Nausea And Vomiting    Dizziness. Given 10 years ago  . Calcitonin ([place]) Nausea And Vomiting    dizziness  . Ciprofloxacin Nausea Only    Very sick within 2 days of taking antibiotic    Current Outpatient Medications  Medication Sig Dispense Refill  . acetaminophen (TYLENOL) 500 MG tablet Take 500 mg by mouth every 6 (six) hours as needed.    Marland Kitchen amLODipine (NORVASC) 5 MG tablet Take 1 tablet (5 mg total) by mouth daily. 30 tablet 0  . azelastine (ASTELIN) 0.1 % nasal spray instill 2 sprays into each nostril twice a day for 7 days then twice a day if needed congestion    . Calcium Carbonate-Vitamin D (CALTRATE 600+D PO) Take 1 tablet by mouth daily.     . Cholecalciferol 10000 units TABS Take 1,000 Units by mouth daily. In the morning    . pantoprazole (PROTONIX) 40 MG tablet Take 1 tablet (40 mg total) by mouth 2 (two) times daily. (Patient taking differently: Take 40 mg by mouth daily. In the morning.) 28 tablet 0  . tretinoin (RETIN-A) 0.1 % cream Apply 1 application topically at  bedtime as needed (SKIN DISCOLORATION). Uses every other night or so on face due to spot on nose that darkens    . vitamin B-12 (CYANOCOBALAMIN) 500 MCG tablet Take 500 mcg by mouth daily. 1 gummie in the morning    . atorvastatin (LIPITOR) 10 MG tablet Take 10 mg by mouth daily. In the morning     No current facility-administered medications for this visit.     Review of Systems Review of Systems  Constitutional: Negative.   Respiratory: Negative.   Cardiovascular: Negative.     Blood pressure 118/70, pulse (!) 115, resp. rate 13, height 5' 1"  (1.549 m), weight 130 lb (59 kg).  Physical Exam Physical Exam  Constitutional: She is oriented to person, place, and time. She appears  well-developed and well-nourished.  Cardiovascular: Normal rate, regular rhythm and normal heart sounds.  Pulmonary/Chest: Effort normal and breath sounds normal.  Abdominal: Soft. Normal appearance and bowel sounds are normal.    Incision site are clean and healing well.   Neurological: She is alert and oriented to person, place, and time.  Skin: Skin is warm and dry.    Data Reviewed Case presentation at [facility] tumor board suggested the patient would benefit from adjuvant chemotherapy.  Radiation therapy to be considered of the right paracolic gutter based on the "positive" radial margin (less than 1 mm).  Patient met with medical oncology earlier today in regards to adjuvant chemotherapy.  Assessment    Doing well post right colectomy.    Plan    The patient was concerned about her stool frequency.  She was reassured that this will likely decrease over the coming weeks.    Patient is still considering whether she will take the recommended chemotherapy.  She is excellent health and this was encouraged.  I do not know if the patient will require central venous access but we briefly discussed the port procedure if this is required.    Patient to return in one month. The patient is aware to call back for any questions or concerns.  HPI, Physical Exam, Assessment and Plan have been scribed under the direction and in the presence of Hervey Ard, MD.  Gaspar Cola, CMA  I have completed the exam and reviewed the above documentation for accuracy and completeness.  I agree with the above.  Haematologist has been used and any errors in dictation or transcription are unintentional.  Hervey Ard, M.D., F.A.C.S.   [name] 06/15/2017, 6:37 AM

## 2017-06-13 NOTE — Patient Instructions (Addendum)
Patient to return in one month. The patient is aware to call back for any questions or concerns. 

## 2017-06-15 ENCOUNTER — Encounter: Payer: Self-pay | Admitting: General Surgery

## 2017-06-15 NOTE — Progress Notes (Signed)
Thank you :)

## 2017-06-16 ENCOUNTER — Ambulatory Visit
Admission: RE | Admit: 2017-06-16 | Discharge: 2017-06-16 | Disposition: A | Discharge status: Home / Self Care | Payer: PPO | Source: Ambulatory Visit | Attending: Oncology | Admitting: Oncology

## 2017-06-16 DIAGNOSIS — C189 Malignant neoplasm of colon, unspecified: Secondary | ICD-10-CM | POA: Diagnosis not present

## 2017-06-16 DIAGNOSIS — C182 Malignant neoplasm of ascending colon: Secondary | ICD-10-CM | POA: Insufficient documentation

## 2017-06-16 DIAGNOSIS — I700 Atherosclerosis of aorta: Secondary | ICD-10-CM | POA: Diagnosis not present

## 2017-06-16 MED ORDER — IOPAMIDOL (ISOVUE-300) INJECTION 61%
75.0000 mL | Freq: Once | INTRAVENOUS | Status: AC | PRN
Start: 2017-06-16 — End: 2017-06-16
  Administered 2017-06-16: 75 mL via INTRAVENOUS

## 2017-06-20 ENCOUNTER — Inpatient Hospital Stay: Payer: PPO

## 2017-06-20 VITALS — BP 122/71 | HR 82 | Temp 97.4°F | Resp 18

## 2017-06-20 DIAGNOSIS — D509 Iron deficiency anemia, unspecified: Secondary | ICD-10-CM

## 2017-06-20 DIAGNOSIS — C182 Malignant neoplasm of ascending colon: Secondary | ICD-10-CM | POA: Diagnosis not present

## 2017-06-20 MED ORDER — SODIUM CHLORIDE 0.9 % IV SOLN
510.0000 mg | Freq: Once | INTRAVENOUS | Status: AC
  Administered 2017-06-20: 510 mg via INTRAVENOUS
  Filled 2017-06-20: qty 17

## 2017-06-20 MED ORDER — SODIUM CHLORIDE 0.9 % IV SOLN
Freq: Once | INTRAVENOUS | Status: AC
  Administered 2017-06-20: 14:00:00 via INTRAVENOUS
  Filled 2017-06-20: qty 1000

## 2017-06-27 DIAGNOSIS — D509 Iron deficiency anemia, unspecified: Secondary | ICD-10-CM | POA: Diagnosis not present

## 2017-06-27 DIAGNOSIS — E7849 Other hyperlipidemia: Secondary | ICD-10-CM | POA: Diagnosis not present

## 2017-06-27 DIAGNOSIS — E061 Subacute thyroiditis: Secondary | ICD-10-CM | POA: Diagnosis not present

## 2017-06-27 DIAGNOSIS — R030 Elevated blood-pressure reading, without diagnosis of hypertension: Secondary | ICD-10-CM | POA: Diagnosis not present

## 2017-06-27 DIAGNOSIS — C189 Malignant neoplasm of colon, unspecified: Secondary | ICD-10-CM | POA: Diagnosis not present

## 2017-06-27 DIAGNOSIS — M150 Primary generalized (osteo)arthritis: Secondary | ICD-10-CM | POA: Diagnosis not present

## 2017-06-30 ENCOUNTER — Inpatient Hospital Stay: Payer: PPO | Attending: Oncology | Admitting: Oncology

## 2017-06-30 ENCOUNTER — Telehealth: Payer: Self-pay | Admitting: Pharmacist

## 2017-06-30 ENCOUNTER — Encounter: Payer: Self-pay | Admitting: Oncology

## 2017-06-30 VITALS — BP 130/75 | HR 86 | Temp 98.0°F | Resp 15 | Wt 129.0 lb

## 2017-06-30 DIAGNOSIS — K1379 Other lesions of oral mucosa: Secondary | ICD-10-CM | POA: Insufficient documentation

## 2017-06-30 DIAGNOSIS — Z5111 Encounter for antineoplastic chemotherapy: Secondary | ICD-10-CM | POA: Diagnosis not present

## 2017-06-30 DIAGNOSIS — C182 Malignant neoplasm of ascending colon: Secondary | ICD-10-CM | POA: Diagnosis not present

## 2017-06-30 DIAGNOSIS — D509 Iron deficiency anemia, unspecified: Secondary | ICD-10-CM | POA: Diagnosis not present

## 2017-06-30 DIAGNOSIS — Z87891 Personal history of nicotine dependence: Secondary | ICD-10-CM | POA: Diagnosis not present

## 2017-06-30 DIAGNOSIS — R197 Diarrhea, unspecified: Secondary | ICD-10-CM | POA: Diagnosis not present

## 2017-06-30 DIAGNOSIS — Z7189 Other specified counseling: Secondary | ICD-10-CM | POA: Insufficient documentation

## 2017-06-30 DIAGNOSIS — C189 Malignant neoplasm of colon, unspecified: Secondary | ICD-10-CM

## 2017-06-30 DIAGNOSIS — R112 Nausea with vomiting, unspecified: Secondary | ICD-10-CM | POA: Insufficient documentation

## 2017-06-30 MED ORDER — CAPECITABINE 500 MG PO TABS: 1000.0000 mg/m2 | ORAL_TABLET | Freq: Two times a day (BID) | ORAL | 1 refills | Status: AC

## 2017-06-30 NOTE — Telephone Encounter (Signed)
Oral Oncology Pharmacist Encounter  Received new prescription for Xeloda (capecitabine) for the treatment of stage III colon cancer in conjunction with oxaliplatin, planned duration until disease progression or unacceptable drug toxicity.  CBC from 06/13/17 and SCr from 06/02/17 assessed, no relevant lab abnormalities. Prescription dose and frequency assessed.   Current medication list in Epic reviewed, one relevant DDIs with Xeloda identified: -Pantoprazole may diminish the effect of Xeloda. Considered whether Pantoprazole is still need or if an H2 antagonist would be an acceptable alternative.  Prescription has been e-scribed to the [facility] for benefits analysis and approval.  Patient education Counseled patient on administration, dosing, side effects, monitoring, drug-food interactions, safe handling, storage, and disposal. Patient will take 3 tablets (1,500 mg total) by mouth 2 (two) times daily after a meal for 14 days. And then take 1 week off.  Side effects include but not limited to: hand and foot syndrome, anemia, neutropenia, thrombocytopenia, fatigue, diarrhea .    Reviewed with patient importance of keeping a medication schedule and plan for any missed doses.  Ms. Cordell voiced understanding and appreciation. All questions answered. Patient was provided with a medication handout.   Oral Oncology Clinic will continue to follow for insurance authorization, copayment issues, and start date.  Provided patient with Oral [place] [facility] phone number. Patient knows to call the office with questions or concerns. Oral Chemotherapy Navigation Clinic will continue to follow.  Darl Pikes, PharmD, BCPS Hematology/Oncology Clinical Pharmacist [facility] [phone]  06/30/2017 2:05 PM

## 2017-07-03 ENCOUNTER — Telehealth: Payer: Self-pay | Admitting: Oncology

## 2017-07-03 DIAGNOSIS — Z7189 Other specified counseling: Secondary | ICD-10-CM | POA: Insufficient documentation

## 2017-07-03 MED ORDER — DEXAMETHASONE 4 MG PO TABS: 8.0000 mg | ORAL_TABLET | Freq: Every day | ORAL | 1 refills | Status: DC

## 2017-07-03 MED ORDER — LORAZEPAM 0.5 MG PO TABS: 0.5000 mg | ORAL_TABLET | Freq: Four times a day (QID) | ORAL | 0 refills | Status: DC | PRN

## 2017-07-03 MED ORDER — LIDOCAINE-PRILOCAINE 2.5-2.5 % EX CREA: TOPICAL_CREAM | CUTANEOUS | 3 refills | Status: DC

## 2017-07-03 MED ORDER — PROCHLORPERAZINE MALEATE 10 MG PO TABS: 10.0000 mg | ORAL_TABLET | Freq: Four times a day (QID) | ORAL | 1 refills | Status: DC | PRN

## 2017-07-03 MED ORDER — ONDANSETRON HCL 8 MG PO TABS: 8.0000 mg | ORAL_TABLET | Freq: Two times a day (BID) | ORAL | 1 refills | Status: DC | PRN

## 2017-07-03 MED FILL — CAPECITABINE 500 MG TABLET: 500 | 21 days supply | Qty: 84 | Fill #0

## 2017-07-03 NOTE — Telephone Encounter (Signed)
Oral Chemotherapy Pharmacist Encounter   Spoke with Ms. Mayse about stopping her pantoprazole (Protonix) due to DDI with her Xeloda. We discussed alternatives such as Hs antagonist that would be ok to take with her Xeloda.   I will make patient calendar for her Xeloda and give it to her at her clinic appt on 07/07/17.   Darl Pikes, PharmD, BCPS Hematology/Oncology Clinical Pharmacist [facility] [phone]  07/03/2017 11:48 AM

## 2017-07-03 NOTE — Progress Notes (Signed)
START ON PATHWAY REGIMEN - Colorectal     A cycle is every 21 days:     Capecitabine      Oxaliplatin   **Always confirm dose/schedule in your pharmacy ordering system**  Patient Characteristics: Colon Adjuvant, Stage III, Low Risk (T1-3, N1) Current evidence of distant metastases<= No AJCC T Category: T3 AJCC N Category: N1a AJCC M Category: M0 AJCC 8 Stage Grouping: IIIB Intent of Therapy: Curative Intent, Discussed with Patient 

## 2017-07-03 NOTE — Telephone Encounter (Signed)
Oral Oncology Patient Advocate Encounter  Emailed [facility] to please mail patients Xeloda. Called [facility] to give them patients CC information.    [place] Patient Advocate [phone] 07/03/2017 11:44 AM

## 2017-07-03 NOTE — Progress Notes (Signed)
Hematology/Oncology Consult note [facility]  Telephone:([phone] [phone]  Patient Care Team: Adin Hector, MD as PCP - General (Internal Medicine)   Name of the patient: Lizmarie Witters  [id number]  01-09-1936   Date of visit: 07/03/17  Diagnosis- 1. Newly diagnosed Stage III colon cancer 2. Iron deficiency anemia  Chief complaint/ Reason for visit- discuss chemotherapy options  Heme/Onc history: Patient is a 82 year old female who was initially seen by me in May 2018.  She was referred to me for her microcytic anemia which was found on her labs in April 2018 when her hemoglobin dropped down to 7 from a baseline of 12- 6 months prior.  Prior to that she has never had an EGD or colonoscopy in the past.  Stool occult cards checked by PCP Dr. Caryl Comes were negative.  Labs were consistent with iron deficiency anemia and patient received 2 doses of Feraheme.  She was also found to be positive for H pylori and was treated with triple antibiotic therapy followed by repeat stool test which was negative.  Celiac disease panel also revealed an elevated TTG.  Urinalysis was negative for hematuria.  She was also referred to [facility] GI (after she did not want to see [facility]) during her initial visit.   With regards to her iron deficiency anemia: Her hemoglobin improved to 10 after 2 doses of Feraheme.  She continued to have iron deficiency but did not wish to take IV iron and continue to take oral iron.  Hemoglobin in October 2018 rose to 12 with continued iron deficiency noted on iron studies.  Patient again did not wish to take IV iron and stayed on oral iron  With regards to her GI workup-she was referred to GI in May 2018 and seen by them in June 2018. Upper endoscopy was scheduled in October 2018.  She was supposed to undergo both EGD and colonoscopy.  However patient complained of constipation due to iron pills and her colonoscopy was  canceled in October 2018 and rescheduled to April 2019.  Upper endoscopy was unremarkable and 2 biopsies were taken from the duodenum for celiac disease which were negative  Her hemoglobin then dropped from 12 to 9.9 when checked by Dr. Caryl Comes in December 2018 and a request was made to move her colonoscopy up.  She therefore underwent colonoscopy on 05/09/17 which showed a fungating and infiltrative partially obstructing large mass in the mid ascending colon measuring about 4 cm.  Pathology showed invasive adenocarcinoma.  Moderately differentiated  Patient underwent right hemicolectomy on 06/02/2017 which showed invasive adenocarcinoma 3.1 cm, grade 2.  Tumor invades through the muscularis propria into pericolonic colorectal tissue.  Proximal and distal margins were negative but radial margin was positive.  LV I was present.  PNI not identified.  2 tumor deposits were present.  1 out of 36 lymph nodes was positive for metastatic disease.  Pathological stage pT3 pN1a. MSI stable    Interval history- reports feeling well. Bowel movements are more regular now. Denies any fatigue  ECOG PS- 0 Pain scale- 0   Review of systems- Review of Systems  Constitutional: Negative for chills, fever, malaise/fatigue and weight loss.  HENT: Negative for congestion, ear discharge and nosebleeds.   Eyes: Negative for blurred vision.  Respiratory: Negative for cough, hemoptysis, sputum production, shortness of breath and wheezing.   Cardiovascular: Negative for chest pain, palpitations, orthopnea and claudication.  Gastrointestinal: Negative for abdominal pain, blood in  stool, constipation, diarrhea, heartburn, melena, nausea and vomiting.  Genitourinary: Negative for dysuria, flank pain, frequency, hematuria and urgency.  Musculoskeletal: Negative for back pain, joint pain and myalgias.  Skin: Negative for rash.  Neurological: Negative for dizziness, tingling, focal weakness, seizures, weakness and headaches.    Endo/Heme/Allergies: Does not bruise/bleed easily.  Psychiatric/Behavioral: Negative for depression and suicidal ideas. The patient does not have insomnia.        Allergies  Allergen Reactions  . Actonel [Risedronate Sodium] Nausea And Vomiting    Dizziness. Given 10 years ago  . Calcitonin ([place]) Nausea And Vomiting    dizziness  . Ciprofloxacin Nausea Only    Very sick within 2 days of taking antibiotic     Past Medical History:  Diagnosis Date  . Cancer ([place]) 05/09/2017   INVASIVE ADENOCARCINOMA, MODERATELY DIFFERENTIATED, ascending colon  . Dysrhythmia    tachycardia occasionally  . H/O herpes zoster   . History of Helicobacter pylori infection   . Hyperlipidemia   . Hypertension   . Iron deficiency anemia    was on feraheme but stopped due to side effects  . Leukopenia   . Osteoarthritis involving multiple joints on both sides of body   . Osteopenia   . Subacute thyroiditis   . Tachycardia      Past Surgical History:  Procedure Laterality Date  . CHOLECYSTECTOMY  2003   Dr Nicholes Stairs  . COLON RESECTION Right 06/02/2017   Laparoscopically assisted right colectomy.  pT3 pN1a, No loss of nuclear expression of MMR proteins.  Surgeon: Robert Bellow, MD;  Location: [facility] ORS;  Service: General;  Laterality: Right;  . COLONOSCOPY WITH PROPOFOL N/A 05/09/2017   Procedure: COLONOSCOPY WITH PROPOFOL;  Surgeon: Lollie Sails, MD;  Location: [facility] ENDOSCOPY;  Service: Endoscopy;  Laterality: N/A;  . ESOPHAGOGASTRODUODENOSCOPY (EGD) WITH PROPOFOL N/A 03/21/2017   Procedure: ESOPHAGOGASTRODUODENOSCOPY (EGD) WITH PROPOFOL;  Surgeon: Lollie Sails, MD;  Location: [facility] ENDOSCOPY;  Service: Endoscopy;  Laterality: N/A;  . EYE SURGERY Left    cataract surgery  . hyperlipedemia N/A   . irregular HR      Social History   Socioeconomic History  . Marital status: Married    Spouse name: Not on file  . Number of children: Not on file  . Years of education: Not on  file  . Highest education level: Not on file  Social Needs  . Financial resource strain: Not on file  . Food insecurity - worry: Not on file  . Food insecurity - inability: Not on file  . Transportation needs - medical: Not on file  . Transportation needs - non-medical: Not on file  Occupational History  . Not on file  Tobacco Use  . Smoking status: Former Research scientist (life sciences)  . Smokeless tobacco: Never Used  . Tobacco comment: smoked in college  Substance and Sexual Activity  . Alcohol use: Yes    Comment: OCCASIONAL  . Drug use: No  . Sexual activity: No  Other Topics Concern  . Not on file  Social History Narrative  . Not on file    Family History  Problem Relation Age of Onset  . Heart disease Father   . Hypertension Father   . Heart attack Father   . Heart attack Paternal Uncle   . Heart attack Paternal Uncle   . Heart attack Paternal Uncle      Current Outpatient Medications:  .  amLODipine (NORVASC) 5 MG tablet, Take 1 tablet (5 mg total) by  mouth daily., Disp: 30 tablet, Rfl: 0 .  azelastine (ASTELIN) 0.1 % nasal spray, instill 2 sprays into each nostril twice a day for 7 days then twice a day if needed congestion, Disp: , Rfl:  .  Calcium Carbonate-Vitamin D (CALTRATE 600+D PO), Take 1 tablet by mouth daily. , Disp: , Rfl:  .  Cholecalciferol 10000 units TABS, Take 1,000 Units by mouth daily. In the morning, Disp: , Rfl:  .  pantoprazole (PROTONIX) 40 MG tablet, Take 1 tablet (40 mg total) by mouth 2 (two) times daily. (Patient taking differently: Take 40 mg by mouth daily. In the morning.), Disp: 28 tablet, Rfl: 0 .  tretinoin (RETIN-A) 0.1 % cream, Apply 1 application topically at bedtime as needed (SKIN DISCOLORATION). Uses every other night or so on face due to spot on nose that darkens, Disp: , Rfl:  .  vitamin B-12 (CYANOCOBALAMIN) 500 MCG tablet, Take 500 mcg by mouth daily. 1 gummie in the morning, Disp: , Rfl:  .  acetaminophen (TYLENOL) 500 MG tablet, Take 500 mg by  mouth every 6 (six) hours as needed., Disp: , Rfl:  .  atorvastatin (LIPITOR) 10 MG tablet, Take 10 mg by mouth daily. In the morning, Disp: , Rfl:  .  capecitabine (XELODA) 500 MG tablet, Take 3 tablets (1,500 mg total) by mouth 2 (two) times daily after a meal for 14 days. And then take 1 week off, Disp: 84 tablet, Rfl: 1  Physical exam:  Vitals:   06/30/17 1010  BP: 130/75  Pulse: 86  Resp: 15  Temp: 98 F (36.7 C)  TempSrc: Tympanic  Weight: 129 lb (58.5 kg)   Physical Exam  Constitutional: She is oriented to person, place, and time and well-developed, well-nourished, and in no distress.  HENT:  Head: Normocephalic and atraumatic.  Eyes: EOM are normal. Pupils are equal, round, and reactive to light.  Neck: Normal range of motion.  Cardiovascular: Normal rate, regular rhythm and normal heart sounds.  Pulmonary/Chest: Effort normal and breath sounds normal.  Abdominal: Soft. Bowel sounds are normal.  Surgical scar has healed well  Neurological: She is alert and oriented to person, place, and time.  Skin: Skin is warm and dry.     CMP Latest Ref Rng & Units 06/02/2017  Creatinine 0.44 - 1.00 mg/dL 0.75   CBC Latest Ref Rng & Units 06/13/2017  WBC 3.6 - 11.0 K/uL 5.6  Hemoglobin 12.0 - 16.0 g/dL 8.8(L)  Hematocrit 35.0 - 47.0 % 28.5(L)  Platelets 150 - 440 K/uL 588(H)    No images are attached to the encounter.  Ct Chest W Contrast  Result Date: 06/16/2017 CLINICAL DATA:  Colon cancer. EXAM: CT CHEST WITH CONTRAST TECHNIQUE: Multidetector CT imaging of the chest was performed during intravenous contrast administration. CONTRAST:  71m ISOVUE-300 IOPAMIDOL (ISOVUE-300) INJECTION 61% COMPARISON:  abdomen and pelvis CT 05/30/2017. FINDINGS: Cardiovascular: The heart size is normal. No pericardial effusion. Atherosclerotic calcification is noted in the wall of the thoracic aorta. Mediastinum/Nodes: Multiple mediastinal lymph nodes are evident but do not meet CT criteria for  pathologic enlargement. There is no hilar lymphadenopathy. The esophagus has normal imaging features. There is no axillary lymphadenopathy. Lungs/Pleura: Biapical pleural-parenchymal scarring is evident. Subsegmental atelectasis or linear scarring noted in both lung bases. There is no pulmonary nodule or mass to suggest pulmonary metastatic involvement. Upper Abdomen: Unremarkable. Musculoskeletal: Bone windows reveal no worrisome lytic or sclerotic osseous lesions. IMPRESSION: 1. No evidence for metastatic disease in the chest. 2.  Aortic Atherosclerois (ICD10-170.0) Electronically Signed   By: Misty Stanley M.D.   On: 06/16/2017 16:41     Assessment and plan- Patient is a 82 y.o. female with newly diagnosed adenocarcinoma of the ascending colon at least stage IIIB p T3 pN1a cM0 status post right hemicolectomy  I have personally reviewed CT thorax images independently and discussed the findings with her.  There is no evidence of metastatic disease in her chest.  She therefore has stage IIIB disease  Patient is here again to clarify her adjuvant chemotherapy options to decide which way she would like to go.  I went over possible chemotherapy options including [place] ox every 3 weeks for 3 months versus FOLFOX IV every 2 weeks for 6 months.  Typical dose of [place] ox in the regimen includes Xeloda at 1250 mg/m square twice daily 2 weeks on and one week off and oxaliplatin at 130 mg/m every 3 weeks IV.  Given her age I would like to dose reduce his Xeloda 1000 mg/m square twice daily 2 weeks on and one week off and oxaliplatin at 100 mg/m every 3 weeks IV.  If she tolerates oxaliplatin well at this dose I may increase the dose for cycle 2 and cycle 3.  Although this is a shorter regimen of 3 months-Xeloda can cause significant diarrhea as well as skin rash and nausea.  Oxaliplatin can lead to worsening peripheral neuropathy and cytopenias especially thrombocytopenia.  Patient at baseline does not have any  peripheral neuropathy and is highly functional.  The alternative chemotherapy would be FOLFOX given IV every 2 weeks.  Oxaliplatin in this regimen is 85 mg/m square and 5-FU is given both as a bolus as well as infusional 5-FU over 46 hours and patient will have to come back on day 3 to get her pump disconnected.  This is done for 6 months.  Discussed risks and benefits of FOLFOX including all but not limited to nausea, vomiting, fatigue, low blood counts and risk of infections as well as risk of peripheral neuropathy associated with oxaliplatin.  Patient understands both her options and wishes to pursue 3 months of [place] ox at this time.  Chemotherapy will be given with curative intent.  We will assess her veins to see if she needs a port for oxaliplatin or if it can be given through peripheral IV as she would need only 4 doses.  Iron deficiency anemia: [place] is post 2 doses of Feraheme last dose given on 06/20/2017.  Repeat iron studies in 2 months  I will see her back in 1 week's time with CBC CMP for cycle #1 of oxaliplatin.    She will be meeting with Allyson from oral pharmacy today to discuss side effects of Xeloda as well as co-pay needs     Visit Diagnosis 1. Malignant neoplasm of colon, unspecified part of colon ([place])   2. Goals of care, counseling/discussion      Dr. Randa Evens, MD, MPH [facility] at [facility] Pager- [phone] 07/03/2017 12:27 PM

## 2017-07-04 ENCOUNTER — Telehealth: Payer: Self-pay | Admitting: *Deleted

## 2017-07-04 NOTE — Patient Instructions (Signed)
Oxaliplatin Injection  What is this medicine?  OXALIPLATIN (ox AL i PLA tin) is a chemotherapy drug. It targets fast dividing cells, like cancer cells, and causes these cells to die. This medicine is used to treat cancers of the colon and rectum, and many other cancers.  This medicine may be used for other purposes; ask your health care provider or pharmacist if you have questions.  COMMON BRAND NAME(S): Eloxatin  What should I tell my health care provider before I take this medicine?  They need to know if you have any of these conditions:  -kidney disease  -an unusual or allergic reaction to oxaliplatin, other chemotherapy, other medicines, foods, dyes, or preservatives  -pregnant or trying to get pregnant  -breast-feeding  How should I use this medicine?  This drug is given as an infusion into a vein. It is administered in a hospital or clinic by a specially trained health care professional.  Talk to your pediatrician regarding the use of this medicine in children. Special care may be needed.  Overdosage: If you think you have taken too much of this medicine contact a poison control center or emergency room at once.  NOTE: This medicine is only for you. Do not share this medicine with others.  What if I miss a dose?  It is important not to miss a dose. Call your doctor or health care professional if you are unable to keep an appointment.  What may interact with this medicine?  -medicines to increase blood counts like filgrastim, pegfilgrastim, sargramostim  -probenecid  -some antibiotics like amikacin, gentamicin, neomycin, polymyxin B, streptomycin, tobramycin  -zalcitabine  Talk to your doctor or health care professional before taking any of these medicines:  -acetaminophen  -aspirin  -ibuprofen  -ketoprofen  -naproxen  This list may not describe all possible interactions. Give your health care provider a list of all the medicines, herbs, non-prescription drugs, or dietary supplements you use. Also tell them if  you smoke, drink alcohol, or use illegal drugs. Some items may interact with your medicine.  What should I watch for while using this medicine?  Your condition will be monitored carefully while you are receiving this medicine. You will need important blood work done while you are taking this medicine.  This medicine can make you more sensitive to cold. Do not drink cold drinks or use ice. Cover exposed skin before coming in contact with cold temperatures or cold objects. When out in cold weather wear warm clothing and cover your mouth and nose to warm the air that goes into your lungs. Tell your doctor if you get sensitive to the cold.  This drug may make you feel generally unwell. This is not uncommon, as chemotherapy can affect healthy cells as well as cancer cells. Report any side effects. Continue your course of treatment even though you feel ill unless your doctor tells you to stop.  In some cases, you may be given additional medicines to help with side effects. Follow all directions for their use.  Call your doctor or health care professional for advice if you get a fever, chills or sore throat, or other symptoms of a cold or flu. Do not treat yourself. This drug decreases your body's ability to fight infections. Try to avoid being around people who are sick.  This medicine may increase your risk to bruise or bleed. Call your doctor or health care professional if you notice any unusual bleeding.  Be careful brushing and flossing your teeth or   using a toothpick because you may get an infection or bleed more easily. If you have any dental work done, tell your dentist you are receiving this medicine.  Avoid taking products that contain aspirin, acetaminophen, ibuprofen, naproxen, or ketoprofen unless instructed by your doctor. These medicines may hide a fever.  Do not become pregnant while taking this medicine. Women should inform their doctor if they wish to become pregnant or think they might be pregnant. There  is a potential for serious side effects to an unborn child. Talk to your health care professional or pharmacist for more information. Do not breast-feed an infant while taking this medicine.  Call your doctor or health care professional if you get diarrhea. Do not treat yourself.  What side effects may I notice from receiving this medicine?  Side effects that you should report to your doctor or health care professional as soon as possible:  -allergic reactions like skin rash, itching or hives, swelling of the face, lips, or tongue  -low blood counts - This drug may decrease the number of white blood cells, red blood cells and platelets. You may be at increased risk for infections and bleeding.  -signs of infection - fever or chills, cough, sore throat, pain or difficulty passing urine  -signs of decreased platelets or bleeding - bruising, pinpoint red spots on the skin, black, tarry stools, nosebleeds  -signs of decreased red blood cells - unusually weak or tired, fainting spells, lightheadedness  -breathing problems  -chest pain, pressure  -cough  -diarrhea  -jaw tightness  -mouth sores  -nausea and vomiting  -pain, swelling, redness or irritation at the injection site  -pain, tingling, numbness in the hands or feet  -problems with balance, talking, walking  -redness, blistering, peeling or loosening of the skin, including inside the mouth  -trouble passing urine or change in the amount of urine  Side effects that usually do not require medical attention (report to your doctor or health care professional if they continue or are bothersome):  -changes in vision  -constipation  -hair loss  -loss of appetite  -metallic taste in the mouth or changes in taste  -stomach pain  This list may not describe all possible side effects. Call your doctor for medical advice about side effects. You may report side effects to FDA at 1-800-FDA-1088.  Where should I keep my medicine?  This drug is given in a hospital or clinic and  will not be stored at home.  NOTE: This sheet is a summary. It may not cover all possible information. If you have questions about this medicine, talk to your doctor, pharmacist, or health care provider.   2018 Elsevier/Gold Standard (2007-12-04 17:22:47)

## 2017-07-04 NOTE — Telephone Encounter (Signed)
She states Dr Bary Castilla gave a RX for Norvasc when she left the hospital, her last dose is tomorrow and was wondering if she needed any more. She states her blood pressure does go up and down at random times. She will see Dr Janese Banks at the [facility] for treatments tomorrow and will discuss with them as well. She will take her blood pressure daily and bring the list to her follow up with Dr Bary Castilla next week, pt agrees.

## 2017-07-05 ENCOUNTER — Inpatient Hospital Stay: Payer: PPO

## 2017-07-07 ENCOUNTER — Encounter: Payer: Self-pay | Admitting: Oncology

## 2017-07-07 ENCOUNTER — Inpatient Hospital Stay: Payer: PPO

## 2017-07-07 ENCOUNTER — Inpatient Hospital Stay (HOSPITAL_BASED_OUTPATIENT_CLINIC_OR_DEPARTMENT_OTHER): Payer: PPO | Admitting: Oncology

## 2017-07-07 ENCOUNTER — Other Ambulatory Visit: Payer: Self-pay | Admitting: *Deleted

## 2017-07-07 ENCOUNTER — Telehealth: Payer: Self-pay | Admitting: Pharmacist

## 2017-07-07 VITALS — BP 132/76 | HR 95 | Temp 98.6°F | Resp 18 | Ht 61.0 in | Wt 128.9 lb

## 2017-07-07 DIAGNOSIS — D509 Iron deficiency anemia, unspecified: Secondary | ICD-10-CM | POA: Diagnosis not present

## 2017-07-07 DIAGNOSIS — Z5111 Encounter for antineoplastic chemotherapy: Secondary | ICD-10-CM

## 2017-07-07 DIAGNOSIS — Z87891 Personal history of nicotine dependence: Secondary | ICD-10-CM

## 2017-07-07 DIAGNOSIS — C189 Malignant neoplasm of colon, unspecified: Secondary | ICD-10-CM

## 2017-07-07 DIAGNOSIS — C182 Malignant neoplasm of ascending colon: Secondary | ICD-10-CM

## 2017-07-07 LAB — CBC WITH DIFFERENTIAL/PLATELET
BASOS ABS: 0 10*3/uL (ref 0–0.1)
Basophils Relative: 1 %
EOS ABS: 0 10*3/uL (ref 0–0.7)
EOS PCT: 0 %
HCT: 38.6 % (ref 35.0–47.0)
Hemoglobin: 12.5 g/dL (ref 12.0–16.0)
LYMPHS ABS: 1.3 10*3/uL (ref 1.0–3.6)
LYMPHS PCT: 20 %
MCH: 27.5 pg (ref 26.0–34.0)
MCHC: 32.3 g/dL (ref 32.0–36.0)
MCV: 85.3 fL (ref 80.0–100.0)
MONO ABS: 0.7 10*3/uL (ref 0.2–0.9)
Monocytes Relative: 11 %
Neutro Abs: 4.5 10*3/uL (ref 1.4–6.5)
Neutrophils Relative %: 68 %
PLATELETS: 276 10*3/uL (ref 150–440)
RBC: 4.53 MIL/uL (ref 3.80–5.20)
RDW: 28.4 % — ABNORMAL HIGH (ref 11.5–14.5)
WBC: 6.6 10*3/uL (ref 3.6–11.0)

## 2017-07-07 LAB — COMPREHENSIVE METABOLIC PANEL
ALBUMIN: 4.6 g/dL (ref 3.5–5.0)
ALK PHOS: 81 U/L (ref 38–126)
ALT: 24 U/L (ref 14–54)
ANION GAP: 9 (ref 5–15)
AST: 27 U/L (ref 15–41)
BILIRUBIN TOTAL: 0.5 mg/dL (ref 0.3–1.2)
BUN: 14 mg/dL (ref 6–20)
CALCIUM: 10 mg/dL (ref 8.9–10.3)
CO2: 26 mmol/L (ref 22–32)
Chloride: 103 mmol/L (ref 101–111)
Creatinine, Ser: 0.76 mg/dL (ref 0.44–1.00)
GLUCOSE: 108 mg/dL — AB (ref 65–99)
Potassium: 3.9 mmol/L (ref 3.5–5.1)
Sodium: 138 mmol/L (ref 135–145)
TOTAL PROTEIN: 7.4 g/dL (ref 6.5–8.1)

## 2017-07-07 MED ORDER — OXALIPLATIN CHEMO INJECTION 100 MG/20ML
150.0000 mg | Freq: Once | INTRAVENOUS | Status: AC
  Administered 2017-07-07: 150 mg via INTRAVENOUS
  Filled 2017-07-07: qty 20

## 2017-07-07 MED ORDER — DEXAMETHASONE SODIUM PHOSPHATE 10 MG/ML IJ SOLN
10.0000 mg | Freq: Once | INTRAMUSCULAR | Status: AC
  Administered 2017-07-07: 10 mg via INTRAVENOUS
  Filled 2017-07-07: qty 1

## 2017-07-07 MED ORDER — DEXTROSE 5 % IV SOLN
Freq: Once | INTRAVENOUS | Status: AC
  Administered 2017-07-07: 11:00:00 via INTRAVENOUS
  Filled 2017-07-07: qty 1000

## 2017-07-07 MED ORDER — DIPHENOXYLATE-ATROPINE 2.5-0.025 MG PO TABS: 1.0000 | ORAL_TABLET | Freq: Four times a day (QID) | ORAL | 0 refills | Status: AC | PRN

## 2017-07-07 MED ORDER — PALONOSETRON HCL INJECTION 0.25 MG/5ML
0.2500 mg | Freq: Once | INTRAVENOUS | Status: AC
  Administered 2017-07-07: 0.25 mg via INTRAVENOUS
  Filled 2017-07-07: qty 5

## 2017-07-07 MED ORDER — UREA 10 % EX CREA: TOPICAL_CREAM | Freq: Three times a day (TID) | CUTANEOUS | 0 refills | Status: AC | PRN

## 2017-07-07 MED ORDER — SODIUM CHLORIDE 0.9 % IV SOLN: 10.0000 mg | Freq: Once | INTRAVENOUS | Status: DC

## 2017-07-07 MED ORDER — DIPHENOXYLATE-ATROPINE 2.5-0.025 MG PO TABS: 1.0000 | ORAL_TABLET | Freq: Four times a day (QID) | ORAL | 0 refills | Status: DC | PRN

## 2017-07-07 NOTE — Progress Notes (Signed)
Hematology/Oncology Consult note [facility]  [phone] [phone]  Patient Care Team: Adin Hector, MD as PCP - General (Internal Medicine)   Name of the patient: Sara Smith  [id number]  1935-11-25   Date of visit: 07/07/17  Diagnosis-1. Newly diagnosed Stage IIIB pT3 pN1a cM0 colon cancer 2. Iron deficiency anemia  Chief complaint/ Reason for visit-discuss chemotherapy options  Heme/Onc history:Patient is a 82 year old female who was initially seen by me in May 2018. She was referred to me for her microcytic anemia which was found on her labs in April 2018 when her hemoglobin dropped down to 7 from a baseline of 12-6 months prior. Prior to that she has never had an EGD or colonoscopy in the past. Stool occult cards checked by PCP Dr. Ciro Backer negative. Labs were consistent with iron deficiency anemia and patient received 2 doses of Feraheme. She was also found to be positive for H pylori and was treated with triple antibiotic therapy followed by repeat stool test which was negative.Celiac disease panel also revealed an elevated TTG. Urinalysis was negative for hematuria. She was also referred to [facility] GI (after she did not want to see [facility]) during her initial visit.  With regards to her iron deficiency anemia:Her hemoglobin improved to 10after 2 doses of Feraheme. She continued to have iron deficiency but did not wish to take IV iron and continue to take oral iron. Hemoglobin in October 2018 rose to 12 with continued iron deficiency noted on iron studies. Patient again did not wish to take IV iron and stayed on oral iron  With regards to her GI workup-she was referred to GI in May 2018and seen by them in June 2018. Upper endoscopy was scheduled in October 2018. She was supposed to undergo both EGD and colonoscopy. However patient complained of constipation due to iron pills and her  colonoscopy was canceled in October 2018 and rescheduled to April 2019. Upper endoscopy was unremarkable and 2 biopsies were taken from the duodenum for celiac disease which were negative  Her hemoglobin then dropped from 12to9.9when checked by Dr. Caryl Comes in December 2018 and a request was made to move her colonoscopy up. She therefore underwent colonoscopy on 12/18/18which showed a fungating and infiltrative partially obstructing large mass in the mid ascending colon measuring about 4 cm. Pathology showed invasive adenocarcinoma. Moderately differentiated  Patient underwent right hemicolectomy on 06/02/2017 which showed invasive adenocarcinoma 3.1 cm, grade 2. Tumor invades through the muscularis propria into pericolonic colorectal tissue. Proximal and distal margins were negative but radial margin was positive. LV I was present. PNI not identified. 2 tumor deposits were present. 1 out of 36 lymph nodes was positive for metastatic disease. Pathological stage pT3pN1a. MSI stable    Interval history-patient is understood her chemotherapy regimen well.  She did take her 3 tablets of Xeloda this morning. She reports having occasional headaches but otherwise doing well. Denies other complaints  ECOG PS- 0 Pain scale- 0   Review of systems- Review of Systems  Constitutional: Negative for chills, fever, malaise/fatigue and weight loss.  HENT: Negative for congestion, ear discharge and nosebleeds.   Eyes: Negative for blurred vision.  Respiratory: Negative for cough, hemoptysis, sputum production, shortness of breath and wheezing.   Cardiovascular: Negative for chest pain, palpitations, orthopnea and claudication.  Gastrointestinal: Negative for abdominal pain, blood in stool, constipation, diarrhea, heartburn, melena, nausea and vomiting.  Genitourinary: Negative for dysuria, flank pain, frequency, hematuria and urgency.  Musculoskeletal: Negative for back pain, joint pain and  myalgias.  Skin: Negative for rash.  Neurological: Positive for headaches. Negative for dizziness, tingling, focal weakness, seizures and weakness.  Endo/Heme/Allergies: Does not bruise/bleed easily.  Psychiatric/Behavioral: Negative for depression and suicidal ideas. The patient does not have insomnia.       Allergies  Allergen Reactions  . Actonel [Risedronate Sodium] Nausea And Vomiting    Dizziness. Given 10 years ago  . Calcitonin ([place]) Nausea And Vomiting    dizziness  . Ciprofloxacin Nausea Only    Very sick within 2 days of taking antibiotic     Past Medical History:  Diagnosis Date  . Cancer ([place]) 05/09/2017   INVASIVE ADENOCARCINOMA, MODERATELY DIFFERENTIATED, ascending colon  . Dysrhythmia    tachycardia occasionally  . H/O herpes zoster   . History of Helicobacter pylori infection   . Hyperlipidemia   . Hypertension   . Iron deficiency anemia    was on feraheme but stopped due to side effects  . Leukopenia   . Osteoarthritis involving multiple joints on both sides of body   . Osteopenia   . Subacute thyroiditis   . Tachycardia      Past Surgical History:  Procedure Laterality Date  . CHOLECYSTECTOMY  2003   Dr Nicholes Stairs  . COLON RESECTION Right 06/02/2017   Laparoscopically assisted right colectomy.  pT3 pN1a, No loss of nuclear expression of MMR proteins.  Surgeon: Robert Bellow, MD;  Location: [facility] ORS;  Service: General;  Laterality: Right;  . COLONOSCOPY WITH PROPOFOL N/A 05/09/2017   Procedure: COLONOSCOPY WITH PROPOFOL;  Surgeon: Lollie Sails, MD;  Location: [facility] ENDOSCOPY;  Service: Endoscopy;  Laterality: N/A;  . ESOPHAGOGASTRODUODENOSCOPY (EGD) WITH PROPOFOL N/A 03/21/2017   Procedure: ESOPHAGOGASTRODUODENOSCOPY (EGD) WITH PROPOFOL;  Surgeon: Lollie Sails, MD;  Location: [facility] ENDOSCOPY;  Service: Endoscopy;  Laterality: N/A;  . EYE SURGERY Left    cataract surgery  . hyperlipedemia N/A   . irregular HR      Social History     Socioeconomic History  . Marital status: Married    Spouse name: Not on file  . Number of children: Not on file  . Years of education: Not on file  . Highest education level: Not on file  Social Needs  . Financial resource strain: Not on file  . Food insecurity - worry: Not on file  . Food insecurity - inability: Not on file  . Transportation needs - medical: Not on file  . Transportation needs - non-medical: Not on file  Occupational History  . Not on file  Tobacco Use  . Smoking status: Former Research scientist (life sciences)  . Smokeless tobacco: Never Used  . Tobacco comment: smoked in college  Substance and Sexual Activity  . Alcohol use: Yes    Comment: OCCASIONAL  . Drug use: No  . Sexual activity: No  Other Topics Concern  . Not on file  Social History Narrative  . Not on file    Family History  Problem Relation Age of Onset  . Heart disease Father   . Hypertension Father   . Heart attack Father   . Heart attack Paternal Uncle   . Heart attack Paternal Uncle   . Heart attack Paternal Uncle      Current Outpatient Medications:  .  amLODipine (NORVASC) 5 MG tablet, Take 1 tablet (5 mg total) by mouth daily., Disp: 30 tablet, Rfl: 0 .  Calcium Carbonate-Vitamin D (CALTRATE 600+D PO), Take 1 tablet by  mouth daily. , Disp: , Rfl:  .  capecitabine (XELODA) 500 MG tablet, Take 3 tablets (1,500 mg total) by mouth 2 (two) times daily after a meal for 14 days. And then take 1 week off, Disp: 84 tablet, Rfl: 1 .  Cholecalciferol 10000 units TABS, Take 1,000 Units by mouth daily. In the morning, Disp: , Rfl:  .  dexamethasone (DECADRON) 4 MG tablet, Take 2 tablets (8 mg total) by mouth daily. Start the day after chemotherapy for 2 days. Take with food., Disp: 30 tablet, Rfl: 1 .  lidocaine-prilocaine (EMLA) cream, Apply to affected area once, Disp: 30 g, Rfl: 3 .  LORazepam (ATIVAN) 0.5 MG tablet, Take 1 tablet (0.5 mg total) by mouth every 6 (six) hours as needed (Nausea or vomiting)., Disp: 30  tablet, Rfl: 0 .  ondansetron (ZOFRAN) 8 MG tablet, Take 1 tablet (8 mg total) by mouth 2 (two) times daily as needed for refractory nausea / vomiting. Start on day 3 after chemotherapy., Disp: 30 tablet, Rfl: 1 .  prochlorperazine (COMPAZINE) 10 MG tablet, Take 1 tablet (10 mg total) by mouth every 6 (six) hours as needed (Nausea or vomiting)., Disp: 30 tablet, Rfl: 1 .  vitamin B-12 (CYANOCOBALAMIN) 500 MCG tablet, Take 500 mcg by mouth daily. 1 gummie in the morning, Disp: , Rfl:  .  acetaminophen (TYLENOL) 500 MG tablet, Take 500 mg by mouth every 6 (six) hours as needed., Disp: , Rfl:  .  atorvastatin (LIPITOR) 10 MG tablet, Take 10 mg by mouth daily. In the morning, Disp: , Rfl:  .  azelastine (ASTELIN) 0.1 % nasal spray, instill 2 sprays into each nostril twice a day for 7 days then twice a day if needed congestion, Disp: , Rfl:  .  pantoprazole (PROTONIX) 40 MG tablet, Take 1 tablet (40 mg total) by mouth 2 (two) times daily. (Patient not taking: Reported on 07/07/2017), Disp: 28 tablet, Rfl: 0 .  tretinoin (RETIN-A) 0.1 % cream, Apply 1 application topically at bedtime as needed (SKIN DISCOLORATION). Uses every other night or so on face due to spot on nose that darkens, Disp: , Rfl:  No current facility-administered medications for this visit.   Facility-Administered Medications Ordered in Other Visits:  .  oxaliplatin (ELOXATIN) 150 mg in dextrose 5 % 500 mL chemo infusion, 150 mg, Intravenous, Once, Sindy Guadeloupe, MD, Last Rate: 265 mL/hr at 07/07/17 1121, 150 mg at 07/07/17 1121  Physical exam:  Vitals:   07/07/17 0933  BP: 132/76  Pulse: 95  Resp: 18  Temp: 98.6 F (37 C)  TempSrc: Tympanic  Weight: 128 lb 14.4 oz (58.5 kg)  Height: 5' 1"  (1.549 m)   Physical Exam  Constitutional: She is oriented to person, place, and time and well-developed, well-nourished, and in no distress.  HENT:  Head: Normocephalic and atraumatic.  Eyes: EOM are normal. Pupils are equal, round, and  reactive to light.  Neck: Normal range of motion.  Cardiovascular: Normal rate, regular rhythm and normal heart sounds.  Pulmonary/Chest: Effort normal and breath sounds normal.  Abdominal: Soft. Bowel sounds are normal.  Neurological: She is alert and oriented to person, place, and time.  Skin: Skin is warm and dry.     CMP Latest Ref Rng & Units 07/07/2017  Glucose 65 - 99 mg/dL 108(H)  BUN 6 - 20 mg/dL 14  Creatinine 0.44 - 1.00 mg/dL 0.76  Sodium 135 - 145 mmol/L 138  Potassium 3.5 - 5.1 mmol/L 3.9  Chloride 101 -  111 mmol/L 103  CO2 22 - 32 mmol/L 26  Calcium 8.9 - 10.3 mg/dL 10.0  Total Protein 6.5 - 8.1 g/dL 7.4  Total Bilirubin 0.3 - 1.2 mg/dL 0.5  Alkaline Phos 38 - 126 U/L 81  AST 15 - 41 U/L 27  ALT 14 - 54 U/L 24   CBC Latest Ref Rng & Units 07/07/2017  WBC 3.6 - 11.0 K/uL 6.6  Hemoglobin 12.0 - 16.0 g/dL 12.5  Hematocrit 35.0 - 47.0 % 38.6  Platelets 150 - 440 K/uL 276    No images are attached to the encounter.  Ct Chest W Contrast  Result Date: 06/16/2017 CLINICAL DATA:  Colon cancer. EXAM: CT CHEST WITH CONTRAST TECHNIQUE: Multidetector CT imaging of the chest was performed during intravenous contrast administration. CONTRAST:  94m ISOVUE-300 IOPAMIDOL (ISOVUE-300) INJECTION 61% COMPARISON:  abdomen and pelvis CT 05/30/2017. FINDINGS: Cardiovascular: The heart size is normal. No pericardial effusion. Atherosclerotic calcification is noted in the wall of the thoracic aorta. Mediastinum/Nodes: Multiple mediastinal lymph nodes are evident but do not meet CT criteria for pathologic enlargement. There is no hilar lymphadenopathy. The esophagus has normal imaging features. There is no axillary lymphadenopathy. Lungs/Pleura: Biapical pleural-parenchymal scarring is evident. Subsegmental atelectasis or linear scarring noted in both lung bases. There is no pulmonary nodule or mass to suggest pulmonary metastatic involvement. Upper Abdomen: Unremarkable. Musculoskeletal: Bone  windows reveal no worrisome lytic or sclerotic osseous lesions. IMPRESSION: 1. No evidence for metastatic disease in the chest. 2.  Aortic Atherosclerois (ICD10-170.0) Electronically Signed   By: EMisty StanleyM.D.   On: 06/16/2017 16:41     Assessment and plan- Patient is a 82y.o. female with adenocarcinoma of the ascending colon at least stage IIIB pT3 pN1a cM0status post right hemicolectomy  We have discussed 3 months of CDarwinox versus 6 months of FOLFOX adjuvant chemotherapy and risks and benefits of each.  Patient understands and wants to proceed with 3 months of CB[place] Treeox chemotherapy.  She is already received her 1 month supply for the [place] and I plan to give that to her at thousand milligrams per meter square twice daily 2 weeks on 1 week off which comes to 1500 mg twice a day.  She understands the potential side effects of Xeloda including all but not limited to skin rash and diarrhea.  I will be prescribing her Lomotil on a as needed basis which she will take with each bowel movement.  I will also prescribe her topical urea cream for prophylaxis of hand-foot syndrome  I will dose reduce her oxaliplatin from 130 mg 2 m 200 mg/m given her age although her performance status is 0 and she does not have any peripheral neuropathy.  If she tolerates this dose well without any significant thrombocytopenia or neuropathy, I will escalate the dose with cycle 2.  She will be getting this every 3 weeks for 4 cycles.  Cycle #1 today.  We plan to give her oxaliplatin through her peripheral IV and hold off on port placement at this time.  If however she does not have a good vascular access in the future she will need port placement  I will see her back in 1 week's time with a CBC and BMP to see how she is doing and how she tolerated her 1 week of Xeloda for possible IV fluids  Iron deficiency anemia: Hemoglobin significantly improved to 12.5 after 2 doses of Feraheme.  I will plan to repeat her iron  studies  with cycle #2 of chemotherapy   Visit Diagnosis 1. Cancer of ascending colon ([place])   2. Encounter for antineoplastic chemotherapy   3. Iron deficiency anemia, unspecified iron deficiency anemia type      Dr. Randa Evens, MD, MPH [facility] Pager- [phone] 07/07/2017 11:51 AM

## 2017-07-07 NOTE — Telephone Encounter (Signed)
Oral Chemotherapy Pharmacist Encounter   Met patient in infusion area and provider her with medication calendar.  Darl Pikes, PharmD, BCPS Hematology/Oncology Clinical Pharmacist [facility] Oral [facility] [phone]  07/07/2017 11:03 AM

## 2017-07-07 NOTE — Progress Notes (Signed)
No new changes today. 

## 2017-07-13 ENCOUNTER — Ambulatory Visit (INDEPENDENT_AMBULATORY_CARE_PROVIDER_SITE_OTHER): Payer: PPO | Admitting: General Surgery

## 2017-07-13 ENCOUNTER — Encounter: Payer: Self-pay | Admitting: General Surgery

## 2017-07-13 VITALS — BP 162/64 | HR 100 | Resp 12 | Ht 62.0 in | Wt 126.0 lb

## 2017-07-13 DIAGNOSIS — C182 Malignant neoplasm of ascending colon: Secondary | ICD-10-CM

## 2017-07-13 NOTE — Patient Instructions (Signed)
Patient to return in 5 months.  The patient is aware to call back for any questions or concerns.

## 2017-07-13 NOTE — Progress Notes (Signed)
Thank you for the followup.

## 2017-07-13 NOTE — Progress Notes (Signed)
Patient ID: Sara Smith, female   DOB: 1935/10/23, 82 y.o.   MRN: [id number]  Chief Complaint  Patient presents with  . Follow-up    HPI Sara Smith is a 82 y.o. female here today for her one month follow up colectomy done on 06/02/2017. Patient states she is doing well. Patient states she started her treatment 07/07/2017.  The patient reports delayed nausea about 4-5 days after her treatments.  No vomiting.  She reports difficulty with the volume of liquid she is been encouraged to drink with the oral agents.  Generally feeling well.  Spirits seem bright. HPI  Past Medical History:  Diagnosis Date  . Cancer ([place]) 05/09/2017   INVASIVE ADENOCARCINOMA, MODERATELY DIFFERENTIATED, ascending colon  . Dysrhythmia    tachycardia occasionally  . H/O herpes zoster   . History of Helicobacter pylori infection   . Hyperlipidemia   . Hypertension   . Iron deficiency anemia    was on feraheme but stopped due to side effects  . Leukopenia   . Osteoarthritis involving multiple joints on both sides of body   . Osteopenia   . Subacute thyroiditis   . Tachycardia     Past Surgical History:  Procedure Laterality Date  . CHOLECYSTECTOMY  2003   Dr Nicholes Stairs  . COLON RESECTION Right 06/02/2017   Laparoscopically assisted right colectomy.  pT3 pN1a, No loss of nuclear expression of MMR proteins.  Surgeon: Robert Bellow, MD;  Location: [facility] ORS;  Service: General;  Laterality: Right;  . COLONOSCOPY WITH PROPOFOL N/A 05/09/2017   Procedure: COLONOSCOPY WITH PROPOFOL;  Surgeon: Lollie Sails, MD;  Location: [facility] ENDOSCOPY;  Service: Endoscopy;  Laterality: N/A;  . ESOPHAGOGASTRODUODENOSCOPY (EGD) WITH PROPOFOL N/A 03/21/2017   Procedure: ESOPHAGOGASTRODUODENOSCOPY (EGD) WITH PROPOFOL;  Surgeon: Lollie Sails, MD;  Location: [facility] ENDOSCOPY;  Service: Endoscopy;  Laterality: N/A;  . EYE SURGERY Left    cataract surgery  . hyperlipedemia N/A   . irregular HR       Family History  Problem Relation Age of Onset  . Heart disease Father   . Hypertension Father   . Heart attack Father   . Heart attack Paternal Uncle   . Heart attack Paternal Uncle   . Heart attack Paternal Uncle     Social History Social History   Tobacco Use  . Smoking status: Former Research scientist (life sciences)  . Smokeless tobacco: Never Used  . Tobacco comment: smoked in college  Substance Use Topics  . Alcohol use: Yes    Comment: OCCASIONAL  . Drug use: No    Allergies  Allergen Reactions  . Actonel [Risedronate Sodium] Nausea And Vomiting    Dizziness. Given 10 years ago  . Calcitonin ([place]) Nausea And Vomiting    dizziness  . Ciprofloxacin Nausea Only    Very sick within 2 days of taking antibiotic    Current Outpatient Medications  Medication Sig Dispense Refill  . acetaminophen (TYLENOL) 500 MG tablet Take 500 mg by mouth every 6 (six) hours as needed.    Marland Kitchen amLODipine (NORVASC) 5 MG tablet Take 1 tablet (5 mg total) by mouth daily. 30 tablet 0  . azelastine (ASTELIN) 0.1 % nasal spray instill 2 sprays into each nostril twice a day for 7 days then twice a day if needed congestion    . Calcium Carbonate-Vitamin D (CALTRATE 600+D PO) Take 1 tablet by mouth daily.     . capecitabine (XELODA) 500 MG tablet Take 3 tablets (1,500 mg total)  by mouth 2 (two) times daily after a meal for 14 days. And then take 1 week off 84 tablet 1  . Cholecalciferol 10000 units TABS Take 1,000 Units by mouth daily. In the morning    . dexamethasone (DECADRON) 4 MG tablet Take 2 tablets (8 mg total) by mouth daily. Start the day after chemotherapy for 2 days. Take with food. 30 tablet 1  . diphenoxylate-atropine (LOMOTIL) 2.5-0.025 MG tablet Take 1 tablet by mouth 4 (four) times daily as needed for diarrhea or loose stools. 60 tablet 0  . lidocaine-prilocaine (EMLA) cream Apply to affected area once 30 g 3  . LORazepam (ATIVAN) 0.5 MG tablet Take 1 tablet (0.5 mg total) by mouth every 6 (six) hours  as needed (Nausea or vomiting). 30 tablet 0  . ondansetron (ZOFRAN) 8 MG tablet Take 1 tablet (8 mg total) by mouth 2 (two) times daily as needed for refractory nausea / vomiting. Start on day 3 after chemotherapy. 30 tablet 1  . pantoprazole (PROTONIX) 40 MG tablet Take 1 tablet (40 mg total) by mouth 2 (two) times daily. 28 tablet 0  . prochlorperazine (COMPAZINE) 10 MG tablet Take 1 tablet (10 mg total) by mouth every 6 (six) hours as needed (Nausea or vomiting). 30 tablet 1  . tretinoin (RETIN-A) 0.1 % cream Apply 1 application topically at bedtime as needed (SKIN DISCOLORATION). Uses every other night or so on face due to spot on nose that darkens    . urea (CARMOL) 10 % cream Apply topically 3 (three) times daily as needed. To skin areas 71 g 0  . vitamin B-12 (CYANOCOBALAMIN) 500 MCG tablet Take 500 mcg by mouth daily. 1 gummie in the morning    . atorvastatin (LIPITOR) 10 MG tablet Take 10 mg by mouth daily. In the morning     No current facility-administered medications for this visit.     Review of Systems Review of Systems  Constitutional: Negative.   Respiratory: Negative.   Cardiovascular: Negative.     Blood pressure (!) 162/64, pulse 100, resp. rate 12, height 5' 2"  (1.575 m), weight 126 lb (57.2 kg).  Physical Exam Physical Exam  Constitutional: She is oriented to person, place, and time. She appears well-developed and well-nourished.  Cardiovascular: Normal rate, regular rhythm and normal heart sounds.  Pulmonary/Chest: Effort normal and breath sounds normal.  Abdominal: Soft. Bowel sounds are normal. There is no tenderness.    Neurological: She is alert and oriented to person, place, and time.  Skin: Skin is warm and dry.    Data Reviewed Medical oncology notes of July 07, 2017 reviewed.  Plans at present are for peripheral chemotherapy administration, facility will request port if needed.  Assessment    This post lap assisted right colectomy.     Plan  Patient to return in 5 months assessment after completion of adjuvant chemotherapy and radiation.  The patient is aware to call back for any questions or concerns.   HPI, Physical Exam, Assessment and Plan have been scribed under the direction and in the presence of Hervey Ard, MD.  Gaspar Cola, CMA  I have completed the exam and reviewed the above documentation for accuracy and completeness.  I agree with the above.  Haematologist has been used and any errors in dictation or transcription are unintentional.  Hervey Ard, M.D., F.A.C.S. [place] [facility] Muaaz Brau 07/13/2017, 1:56 PM

## 2017-07-14 ENCOUNTER — Other Ambulatory Visit: Payer: Self-pay

## 2017-07-14 ENCOUNTER — Inpatient Hospital Stay: Payer: PPO

## 2017-07-14 ENCOUNTER — Inpatient Hospital Stay (HOSPITAL_BASED_OUTPATIENT_CLINIC_OR_DEPARTMENT_OTHER): Payer: PPO | Admitting: Oncology

## 2017-07-14 ENCOUNTER — Encounter: Payer: Self-pay | Admitting: Oncology

## 2017-07-14 VITALS — BP 143/81 | HR 92 | Temp 97.4°F | Resp 12 | Ht 62.0 in | Wt 127.0 lb

## 2017-07-14 DIAGNOSIS — C182 Malignant neoplasm of ascending colon: Secondary | ICD-10-CM | POA: Diagnosis not present

## 2017-07-14 DIAGNOSIS — Z5111 Encounter for antineoplastic chemotherapy: Secondary | ICD-10-CM | POA: Diagnosis not present

## 2017-07-14 DIAGNOSIS — C189 Malignant neoplasm of colon, unspecified: Secondary | ICD-10-CM

## 2017-07-14 LAB — BASIC METABOLIC PANEL
Anion gap: 3 — ABNORMAL LOW (ref 5–15)
BUN: 15 mg/dL (ref 6–20)
CHLORIDE: 106 mmol/L (ref 101–111)
CO2: 28 mmol/L (ref 22–32)
CREATININE: 0.91 mg/dL (ref 0.44–1.00)
Calcium: 9.6 mg/dL (ref 8.9–10.3)
GFR calc non Af Amer: 58 mL/min — ABNORMAL LOW (ref >60)
Glucose, Bld: 96 mg/dL (ref 65–99)
Potassium: 4.5 mmol/L (ref 3.5–5.1)
Sodium: 137 mmol/L (ref 135–145)

## 2017-07-14 LAB — CBC WITH DIFFERENTIAL/PLATELET
Basophils Absolute: 0 10*3/uL (ref 0–0.1)
Basophils Relative: 0 %
EOS ABS: 0.1 10*3/uL (ref 0–0.7)
Eosinophils Relative: 1 %
HCT: 39 % (ref 35.0–47.0)
HEMOGLOBIN: 12.7 g/dL (ref 12.0–16.0)
LYMPHS ABS: 1.4 10*3/uL (ref 1.0–3.6)
LYMPHS PCT: 31 %
MCH: 28 pg (ref 26.0–34.0)
MCHC: 32.7 g/dL (ref 32.0–36.0)
MCV: 85.6 fL (ref 80.0–100.0)
MONOS PCT: 5 %
Monocytes Absolute: 0.2 10*3/uL (ref 0.2–0.9)
NEUTROS PCT: 63 %
Neutro Abs: 2.9 10*3/uL (ref 1.4–6.5)
Platelets: 271 10*3/uL (ref 150–440)
RBC: 4.55 MIL/uL (ref 3.80–5.20)
RDW: 27.5 % — ABNORMAL HIGH (ref 11.5–14.5)
WBC: 4.7 10*3/uL (ref 3.6–11.0)

## 2017-07-14 NOTE — Progress Notes (Signed)
Hematology/Oncology Consult note [facility]  [phone] [phone]  Patient Care Team: Adin Hector, MD as PCP - General (Internal Medicine) Bary Castilla Forest Gleason, MD (General Surgery)   Name of the patient: Sara Smith  [id number]  [birth year]   Date of visit: 07/14/17  Diagnosis-1. Newly diagnosed Stage IIIB pT3 pN1a cM0 colon cancer 2. Iron deficiency anemia  Chief complaint/ Reason for visit-symptom assessment 1 week after cycle 1 of oxaliplatin  Heme/Onc history:Patient is a 82 year old female who was initially seen by me in May 2018. She was referred to me for her microcytic anemia which was found on her labs in April 2018 when her hemoglobin dropped down to 7 from a baseline of 12-6 months prior. Prior to that she has never had an EGD or colonoscopy in the past. Stool occult cards checked by PCP Dr. Ciro Backer negative. Labs were consistent with iron deficiency anemia and patient received 2 doses of Feraheme. She was also found to be positive for H pylori and was treated with triple antibiotic therapy followed by repeat stool test which was negative.Celiac disease panel also revealed an elevated TTG. Urinalysis was negative for hematuria. She was also referred to [facility] GI (after she did not want to see [place] [facility]) during her initial visit.  With regards to her iron deficiency anemia:Her hemoglobin improved to 10after 2 doses of Feraheme. She continued to have iron deficiency but did not wish to take IV iron and continue to take oral iron. Hemoglobin in October 2018 rose to 12 with continued iron deficiency noted on iron studies. Patient again did not wish to take IV iron and stayed on oral iron  With regards to her GI workup-she was referred to GI in May 2018and seen by them in June 2018. Upper endoscopy was scheduled in October 2018. She was supposed to undergo both EGD and colonoscopy. However  patient complained of constipation due to iron pills and her colonoscopy was canceled in October 2018 and rescheduled to April 2019. Upper endoscopy was unremarkable and 2 biopsies were taken from the duodenum for celiac disease which were negative  Her hemoglobin then dropped from 12to9.9when checked by Dr. Caryl Comes in December 2018 and a request was made to move her colonoscopy up. She therefore underwent colonoscopy on 12/18/18which showed a fungating and infiltrative partially obstructing large mass in the mid ascending colon measuring about 4 cm. Pathology showed invasive adenocarcinoma. Moderately differentiated  Patient underwent right hemicolectomy on 06/02/2017 which showed invasive adenocarcinoma 3.1 cm, grade 2. Tumor invades through the muscularis propria into pericolonic colorectal tissue. Proximal and distal margins were negative but radial margin was positive. LV I was present. PNI not identified. 2 tumor deposits were present. 1 out of 36 lymph nodes was positive for metastatic disease. Pathological stage pT3pN1a. MSI stable     Interval history-patient is currently taking Xeloda 3 pills in the morning and evening and tolerating it well.  Reports no significant diarrhea.  On one day she felt constipated and ate a lot of kale which was followed by loose stools the next day and that episode was self-limited and no diarrhea since then.  Denies any skin rash or nausea.  Has occasional cold sensitivity on touching cold objects but denies any tingling or persistent numbness.  She continues to be active and exercises regularly  ECOG PS- 0 Pain scale- 0   Review of systems- Review of Systems  Constitutional: Negative for chills, fever, malaise/fatigue and weight  loss.  HENT: Negative for congestion, ear discharge and nosebleeds.   Eyes: Negative for blurred vision.  Respiratory: Negative for cough, hemoptysis, sputum production, shortness of breath and wheezing.     Cardiovascular: Negative for chest pain, palpitations, orthopnea and claudication.  Gastrointestinal: Negative for abdominal pain, blood in stool, constipation, diarrhea, heartburn, melena, nausea and vomiting.  Genitourinary: Negative for dysuria, flank pain, frequency, hematuria and urgency.  Musculoskeletal: Negative for back pain, joint pain and myalgias.  Skin: Negative for rash.  Neurological: Negative for dizziness, tingling, focal weakness, seizures, weakness and headaches.  Endo/Heme/Allergies: Does not bruise/bleed easily.  Psychiatric/Behavioral: Negative for depression and suicidal ideas. The patient does not have insomnia.       Allergies  Allergen Reactions  . Actonel [Risedronate Sodium] Nausea And Vomiting    Dizziness. Given 10 years ago  . Calcitonin ([place]) Nausea And Vomiting    dizziness  . Ciprofloxacin Nausea Only    Very sick within 2 days of taking antibiotic     Past Medical History:  Diagnosis Date  . Cancer ([place]) 05/09/2017   INVASIVE ADENOCARCINOMA, MODERATELY DIFFERENTIATED, ascending colon  . Dysrhythmia    tachycardia occasionally  . H/O herpes zoster   . History of Helicobacter pylori infection   . Hyperlipidemia   . Hypertension   . Iron deficiency anemia    was on feraheme but stopped due to side effects  . Leukopenia   . Osteoarthritis involving multiple joints on both sides of body   . Osteopenia   . Subacute thyroiditis   . Tachycardia      Past Surgical History:  Procedure Laterality Date  . CHOLECYSTECTOMY  2003   Dr Nicholes Stairs  . COLON RESECTION Right 06/02/2017   Laparoscopically assisted right colectomy.  pT3 pN1a, No loss of nuclear expression of MMR proteins.  Surgeon: Robert Bellow, MD;  Location: [facility] ORS;  Service: General;  Laterality: Right;  . COLONOSCOPY WITH PROPOFOL N/A 05/09/2017   Procedure: COLONOSCOPY WITH PROPOFOL;  Surgeon: Lollie Sails, MD;  Location: [facility] ENDOSCOPY;  Service: Endoscopy;   Laterality: N/A;  . ESOPHAGOGASTRODUODENOSCOPY (EGD) WITH PROPOFOL N/A 03/21/2017   Procedure: ESOPHAGOGASTRODUODENOSCOPY (EGD) WITH PROPOFOL;  Surgeon: Lollie Sails, MD;  Location: [facility] ENDOSCOPY;  Service: Endoscopy;  Laterality: N/A;  . EYE SURGERY Left    cataract surgery  . hyperlipedemia N/A   . irregular HR      Social History   Socioeconomic History  . Marital status: Married    Spouse name: Not on file  . Number of children: Not on file  . Years of education: Not on file  . Highest education level: Not on file  Social Needs  . Financial resource strain: Not on file  . Food insecurity - worry: Not on file  . Food insecurity - inability: Not on file  . Transportation needs - medical: Not on file  . Transportation needs - non-medical: Not on file  Occupational History  . Not on file  Tobacco Use  . Smoking status: Former Research scientist (life sciences)  . Smokeless tobacco: Never Used  . Tobacco comment: smoked in college  Substance and Sexual Activity  . Alcohol use: Yes    Comment: OCCASIONAL  . Drug use: No  . Sexual activity: No  Other Topics Concern  . Not on file  Social History Narrative  . Not on file    Family History  Problem Relation Age of Onset  . Heart disease Father   . Hypertension Father   .  Heart attack Father   . Heart attack Paternal Uncle   . Heart attack Paternal Uncle   . Heart attack Paternal Uncle      Current Outpatient Medications:  .  acetaminophen (TYLENOL) 500 MG tablet, Take 500 mg by mouth every 6 (six) hours as needed., Disp: , Rfl:  .  amLODipine (NORVASC) 5 MG tablet, Take 1 tablet (5 mg total) by mouth daily., Disp: 30 tablet, Rfl: 0 .  atorvastatin (LIPITOR) 10 MG tablet, Take 10 mg by mouth daily. In the morning, Disp: , Rfl:  .  azelastine (ASTELIN) 0.1 % nasal spray, instill 2 sprays into each nostril twice a day for 7 days then twice a day if needed congestion, Disp: , Rfl:  .  Calcium Carbonate-Vitamin D (CALTRATE 600+D PO), Take 1  tablet by mouth daily. , Disp: , Rfl:  .  capecitabine (XELODA) 500 MG tablet, Take 3 tablets (1,500 mg total) by mouth 2 (two) times daily after a meal for 14 days. And then take 1 week off, Disp: 84 tablet, Rfl: 1 .  Cholecalciferol 10000 units TABS, Take 1,000 Units by mouth daily. In the morning, Disp: , Rfl:  .  dexamethasone (DECADRON) 4 MG tablet, Take 2 tablets (8 mg total) by mouth daily. Start the day after chemotherapy for 2 days. Take with food., Disp: 30 tablet, Rfl: 1 .  diphenoxylate-atropine (LOMOTIL) 2.5-0.025 MG tablet, Take 1 tablet by mouth 4 (four) times daily as needed for diarrhea or loose stools., Disp: 60 tablet, Rfl: 0 .  lidocaine-prilocaine (EMLA) cream, Apply to affected area once, Disp: 30 g, Rfl: 3 .  LORazepam (ATIVAN) 0.5 MG tablet, Take 1 tablet (0.5 mg total) by mouth every 6 (six) hours as needed (Nausea or vomiting)., Disp: 30 tablet, Rfl: 0 .  ondansetron (ZOFRAN) 8 MG tablet, Take 1 tablet (8 mg total) by mouth 2 (two) times daily as needed for refractory nausea / vomiting. Start on day 3 after chemotherapy., Disp: 30 tablet, Rfl: 1 .  pantoprazole (PROTONIX) 40 MG tablet, Take 1 tablet (40 mg total) by mouth 2 (two) times daily., Disp: 28 tablet, Rfl: 0 .  prochlorperazine (COMPAZINE) 10 MG tablet, Take 1 tablet (10 mg total) by mouth every 6 (six) hours as needed (Nausea or vomiting)., Disp: 30 tablet, Rfl: 1 .  tretinoin (RETIN-A) 0.1 % cream, Apply 1 application topically at bedtime as needed (SKIN DISCOLORATION). Uses every other night or so on face due to spot on nose that darkens, Disp: , Rfl:  .  urea (CARMOL) 10 % cream, Apply topically 3 (three) times daily as needed. To skin areas, Disp: 71 g, Rfl: 0 .  vitamin B-12 (CYANOCOBALAMIN) 500 MCG tablet, Take 500 mcg by mouth daily. 1 gummie in the morning, Disp: , Rfl:   Physical exam:  Vitals:   07/14/17 0907 07/14/17 0911  BP:  (!) 143/81  Pulse:  92  Resp: 12   Temp:  (!) 97.4 F (36.3 C)    TempSrc:  Tympanic  Weight: 127 lb (57.6 kg)   Height: 5' 2" (1.575 m)    Physical Exam  Constitutional: She is oriented to person, place, and time and well-developed, well-nourished, and in no distress.  HENT:  Head: Normocephalic and atraumatic.  Eyes: EOM are normal. Pupils are equal, round, and reactive to light.  Neck: Normal range of motion.  Cardiovascular: Normal rate, regular rhythm and normal heart sounds.  Pulmonary/Chest: Effort normal and breath sounds normal.  Abdominal: Soft. Bowel sounds  are normal.  Neurological: She is alert and oriented to person, place, and time.  Skin: Skin is warm and dry.     CMP Latest Ref Rng & Units 07/14/2017  Glucose 65 - 99 mg/dL 96  BUN 6 - 20 mg/dL 15  Creatinine 0.44 - 1.00 mg/dL 0.91  Sodium 135 - 145 mmol/L 137  Potassium 3.5 - 5.1 mmol/L 4.5  Chloride 101 - 111 mmol/L 106  CO2 22 - 32 mmol/L 28  Calcium 8.9 - 10.3 mg/dL 9.6  Total Protein 6.5 - 8.1 g/dL -  Total Bilirubin 0.3 - 1.2 mg/dL -  Alkaline Phos 38 - 126 U/L -  AST 15 - 41 U/L -  ALT 14 - 54 U/L -   CBC Latest Ref Rng & Units 07/14/2017  WBC 3.6 - 11.0 K/uL 4.7  Hemoglobin 12.0 - 16.0 g/dL 12.7  Hematocrit 35.0 - 47.0 % 39.0  Platelets 150 - 440 K/uL 271    No images are attached to the encounter.  Ct Chest W Contrast  Result Date: 06/16/2017 CLINICAL DATA:  Colon cancer. EXAM: CT CHEST WITH CONTRAST TECHNIQUE: Multidetector CT imaging of the chest was performed during intravenous contrast administration. CONTRAST:  50m ISOVUE-300 IOPAMIDOL (ISOVUE-300) INJECTION 61% COMPARISON:  abdomen and pelvis CT 05/30/2017. FINDINGS: Cardiovascular: The heart size is normal. No pericardial effusion. Atherosclerotic calcification is noted in the wall of the thoracic aorta. Mediastinum/Nodes: Multiple mediastinal lymph nodes are evident but do not meet CT criteria for pathologic enlargement. There is no hilar lymphadenopathy. The esophagus has normal imaging features. There  is no axillary lymphadenopathy. Lungs/Pleura: Biapical pleural-parenchymal scarring is evident. Subsegmental atelectasis or linear scarring noted in both lung bases. There is no pulmonary nodule or mass to suggest pulmonary metastatic involvement. Upper Abdomen: Unremarkable. Musculoskeletal: Bone windows reveal no worrisome lytic or sclerotic osseous lesions. IMPRESSION: 1. No evidence for metastatic disease in the chest. 2.  Aortic Atherosclerois (ICD10-170.0) Electronically Signed   By: EMisty StanleyM.D.   On: 06/16/2017 16:41     Assessment and plan- Patient is a 82y.o. female with adenocarcinoma of the ascending colon at least stage IIIB pT3 pN1a cM0status post right hemicolectomy s/p cycle 1 of oxaliplatin on PO xeloda  Patient is completed 1 week of her Xeloda treatment so far and has tolerated it very well without any significant side effects.  She will take it for 1 more week followed by a week off and will start her cycle #2 after that.  She received oxaliplatin and 100 mg/m with cycle 1 and has had no significant side effects from it so far.  Her counts are normal today.     I will see her back in 2 weeks with CBC and CMP for cycle 2-day 1 of oxaliplatin and that will be her start of cycle 2-day 1 of Xeloda as well.  Patient has as needed nausea medications at home and she will also receive her urea cream next week which she will use prophylactically to prevent hand-foot syndrome from Xeloda   Visit Diagnosis 1. Cancer of ascending colon ([facility]      Dr. ARanda Evens MD, MPH CFond [facility] Psych Unitat AHenrietta D Goodall HospitalPager- [phone]/22/2019 12:39 PM

## 2017-07-14 NOTE — Progress Notes (Signed)
Patient here for results. She has no complaints today.

## 2017-07-19 ENCOUNTER — Telehealth: Payer: Self-pay | Admitting: *Deleted

## 2017-07-19 ENCOUNTER — Inpatient Hospital Stay: Payer: PPO

## 2017-07-19 ENCOUNTER — Encounter: Payer: Self-pay | Admitting: Oncology

## 2017-07-19 ENCOUNTER — Telehealth: Payer: Self-pay | Admitting: Pharmacist

## 2017-07-19 ENCOUNTER — Inpatient Hospital Stay (HOSPITAL_BASED_OUTPATIENT_CLINIC_OR_DEPARTMENT_OTHER): Payer: PPO | Admitting: Oncology

## 2017-07-19 VITALS — BP 140/83 | HR 101 | Temp 98.2°F | Resp 18 | Ht 62.0 in | Wt 126.4 lb

## 2017-07-19 DIAGNOSIS — R197 Diarrhea, unspecified: Secondary | ICD-10-CM

## 2017-07-19 DIAGNOSIS — R112 Nausea with vomiting, unspecified: Secondary | ICD-10-CM

## 2017-07-19 DIAGNOSIS — Z5189 Encounter for other specified aftercare: Secondary | ICD-10-CM

## 2017-07-19 DIAGNOSIS — C182 Malignant neoplasm of ascending colon: Secondary | ICD-10-CM | POA: Diagnosis not present

## 2017-07-19 DIAGNOSIS — K1379 Other lesions of oral mucosa: Secondary | ICD-10-CM | POA: Diagnosis not present

## 2017-07-19 DIAGNOSIS — Z5111 Encounter for antineoplastic chemotherapy: Secondary | ICD-10-CM | POA: Diagnosis not present

## 2017-07-19 LAB — CBC WITH DIFFERENTIAL/PLATELET
BASOS ABS: 0 10*3/uL (ref 0–0.1)
BASOS PCT: 0 %
EOS ABS: 0 10*3/uL (ref 0–0.7)
EOS PCT: 0 %
HCT: 37.1 % (ref 35.0–47.0)
HEMOGLOBIN: 12.4 g/dL (ref 12.0–16.0)
LYMPHS ABS: 0.9 10*3/uL — AB (ref 1.0–3.6)
Lymphocytes Relative: 9 %
MCH: 28.1 pg (ref 26.0–34.0)
MCHC: 33.4 g/dL (ref 32.0–36.0)
MCV: 84 fL (ref 80.0–100.0)
Monocytes Absolute: 0.4 10*3/uL (ref 0.2–0.9)
Monocytes Relative: 4 %
NEUTROS PCT: 87 %
Neutro Abs: 9.1 10*3/uL — ABNORMAL HIGH (ref 1.4–6.5)
Platelets: 277 10*3/uL (ref 150–440)
RBC: 4.42 MIL/uL (ref 3.80–5.20)
RDW: 27.8 % — ABNORMAL HIGH (ref 11.5–14.5)
WBC: 10.5 10*3/uL (ref 3.6–11.0)

## 2017-07-19 LAB — COMPREHENSIVE METABOLIC PANEL
ALBUMIN: 3.8 g/dL (ref 3.5–5.0)
ALK PHOS: 68 U/L (ref 38–126)
ALT: 17 U/L (ref 14–54)
AST: 18 U/L (ref 15–41)
Anion gap: 8 (ref 5–15)
BUN: 12 mg/dL (ref 6–20)
CALCIUM: 9.3 mg/dL (ref 8.9–10.3)
CHLORIDE: 100 mmol/L — AB (ref 101–111)
CO2: 23 mmol/L (ref 22–32)
CREATININE: 0.7 mg/dL (ref 0.44–1.00)
GFR calc Af Amer: >60 mL/min (ref >60)
GFR calc non Af Amer: >60 mL/min (ref >60)
GLUCOSE: 135 mg/dL — AB (ref 65–99)
Potassium: 4 mmol/L (ref 3.5–5.1)
SODIUM: 131 mmol/L — AB (ref 135–145)
Total Bilirubin: 0.6 mg/dL (ref 0.3–1.2)
Total Protein: 6.9 g/dL (ref 6.5–8.1)

## 2017-07-19 LAB — MAGNESIUM: Magnesium: 1.9 mg/dL (ref 1.7–2.4)

## 2017-07-19 MED ORDER — ONDANSETRON HCL 4 MG/2ML IJ SOLN
8.0000 mg | Freq: Once | INTRAMUSCULAR | Status: AC
  Administered 2017-07-19: 8 mg via INTRAVENOUS
  Filled 2017-07-19: qty 4

## 2017-07-19 MED ORDER — SODIUM CHLORIDE 0.9 % IV SOLN: Freq: Once | INTRAVENOUS | Status: DC

## 2017-07-19 MED ORDER — SODIUM CHLORIDE 0.9 % IV SOLN
Freq: Once | INTRAVENOUS | Status: AC
  Administered 2017-07-19: 11:00:00 via INTRAVENOUS
  Filled 2017-07-19: qty 1000

## 2017-07-19 NOTE — Telephone Encounter (Signed)
Patient contacted Sara Smith, Pharmacist for oral chemotherapy and reports that she is having diarrhea and vomiting, I called patient to discussed symptoms Diarrhea started yesterday at lunch  3 bowel movement yesterday, vomiting started at 2 PM yesterday and vomited 3 times yesterday and once this morning. Does not feel dehydrated. States she is drinking water. She took Lomotil and has taken zofran and compazine. No BM this morning. She reports that this started after eating spicy spaghetti yesterday. She reports that peppermint candy is the only thing that helps.  Offered appointment to see NP and she accepts appointment. Per VO Lorretta Harp, NP labs to be drawn cbc, cmp, mg+

## 2017-07-19 NOTE — Progress Notes (Signed)
Pt started having diarrhea 3 days ago-using imodium. No diarrhea today. N/v for 2 days taking nausea meds but does not feel it is working. Vomiting - yellow bile.

## 2017-07-19 NOTE — Telephone Encounter (Signed)
Oral Chemotherapy Pharmacist Encounter   Received a call from Ms. Telleria. She stated she had some diarrhea and vomiting, both starting yesterday following a "spicy spaghetti".   She reported 3 loose stool yesterday and she vomited yesterday and this morning. For her diarrhea she reported trying Lomotil which she did not think helped. For her vomiting the tried both compazine and ondansetron. Instructed patient to hold her Xeloda for now, until instructed otherwise.  Relayed above information to [place], Arts administrator. She has scheduled the patient to come in to be seen in the supportive care clinic.   Darl Pikes, PharmD, BCPS Hematology/Oncology Clinical Pharmacist [facility] [phone]  07/19/2017 9:52 AM

## 2017-07-19 NOTE — Progress Notes (Signed)
Symptom Management Consult note [facility]  [phone] [phone]  Patient Care Team: Adin Hector, MD as PCP - General (Internal Medicine) Bary Castilla Forest Gleason, MD (General Surgery)   Name of the patient: Sara Smith  [id number]  [birth year]   Date of visit: 07/19/17  Diagnosis- 1. Newly diagnosed Stage IIIB pT3 pN1a cM0colon cancer 2. Iron deficiency anemia  Chief complaint/ Reason for visit- Diarrhea and Nausea/Vomiting  Heme/Onc history: Patient recently diagnosed in December 2018. Had a colonoscopy in December due to a drop in her hemoglobin by Dr. Caryl Comes showing a fungating and infiltrative partially obstructing large mass in the mid ascending colon measuring about 4 cm. Pathology revealed adenocarcinoma moderately differentiated. She had a right hemicolectomy on 01/11/2018IDENTIFIED invasive adenocarcinoma 3.1 cm grade 2. She was started on Xeloda 3 pills in the morning and evening and tolerating it well.   Interval history- She was last seen by Dr. Janese Banks on 07/14/2017 where she continued to tolerate her Xeloda pills well. She had her first cycle of Oxaliplatin on 07/07/2017 and tolerated it well. She reported no significant diarrhea. She denied any skin rashes or nausea and admitted to occasional cold sensitivity on touching cold objects but denies any tingling or numbness.  Patient presents today for diarrhea and vomiting for 3 days. States she started to have diarrhea on Monday morning. Approximately 3 episodes before lunch time. At lunch time she had "spicy spaghetti" and has had intractable nausea with vomiting and diarrhea since. She has not had an appetite and is eaten very little since that meal. She has tried to stay hydrated with drinking plenty of water and Gatorade. She has tried taking both Lomotil and ondansetron and Compazine for symptoms with little relief. She states "I took a few doses and did not see a difference so  I have not taken any more". She denies any abdominal pain or cramping but admits to "foul-smelling stool" she describes it as "watery". She states she has abdominal discomfort while having bowel movements but otherwise does not have any pain. She denies blood in her stools.She denies abdominal bloating. She has been vomiting several times per day since Monday at lunchtime. She states it is yellow and looks like "bile". She denies any associated fevers or illnesses. She denies being around anyone who was sick Additional symptoms are a mild headache that is relieved with Tylenol.   She continues to apply urea cream to tops of fingers to prevent skin peeling and ulcerations. She complains of mild mouth sores that she noticed yesterday. She is rinsing her mouth with mouthwash. Mouth sores have remained unchanged.  ECOG FS:1 - Symptomatic but completely ambulatory  Review of systems- Review of Systems  Constitutional: Positive for malaise/fatigue and weight loss. Negative for chills and fever.  HENT: Negative.   Eyes: Negative.   Respiratory: Negative for cough, sputum production, shortness of breath and wheezing.   Cardiovascular: Negative for chest pain and leg swelling.  Gastrointestinal: Positive for diarrhea, nausea and vomiting. Negative for abdominal pain and blood in stool.  Genitourinary: Negative.   Musculoskeletal: Negative.   Skin: Negative.   Neurological: Positive for sensory change (occasional numbness and tingling in fingers), weakness and headaches. Negative for dizziness, tingling and speech change.  Endo/Heme/Allergies: Negative.   Psychiatric/Behavioral: Negative.     Current treatment- Xeloda and S/P cycle 1 of Oxaliaplatin.   Allergies  Allergen Reactions  . Actonel [Risedronate Sodium] Nausea And Vomiting    Dizziness.  Given 10 years ago  . Calcitonin ([place]) Nausea And Vomiting    dizziness  . Ciprofloxacin Nausea Only    Very sick within 2 days of taking antibiotic       Past Medical History:  Diagnosis Date  . Cancer ([place]) 05/09/2017   INVASIVE ADENOCARCINOMA, MODERATELY DIFFERENTIATED, ascending colon  . Dysrhythmia    tachycardia occasionally  . H/O herpes zoster   . History of Helicobacter pylori infection   . Hyperlipidemia   . Hypertension   . Iron deficiency anemia    was on feraheme but stopped due to side effects  . Leukopenia   . Osteoarthritis involving multiple joints on both sides of body   . Osteopenia   . Subacute thyroiditis   . Tachycardia      Past Surgical History:  Procedure Laterality Date  . CHOLECYSTECTOMY  2003   Dr Nicholes Stairs  . COLON RESECTION Right 06/02/2017   Laparoscopically assisted right colectomy.  pT3 pN1a, No loss of nuclear expression of MMR proteins.  Surgeon: Robert Bellow, MD;  Location: [facility] ORS;  Service: General;  Laterality: Right;  . COLONOSCOPY WITH PROPOFOL N/A 05/09/2017   Procedure: COLONOSCOPY WITH PROPOFOL;  Surgeon: Lollie Sails, MD;  Location: [facility] ENDOSCOPY;  Service: Endoscopy;  Laterality: N/A;  . ESOPHAGOGASTRODUODENOSCOPY (EGD) WITH PROPOFOL N/A 03/21/2017   Procedure: ESOPHAGOGASTRODUODENOSCOPY (EGD) WITH PROPOFOL;  Surgeon: Lollie Sails, MD;  Location: [facility] ENDOSCOPY;  Service: Endoscopy;  Laterality: N/A;  . EYE SURGERY Left    cataract surgery  . hyperlipedemia N/A   . irregular HR      Social History   Socioeconomic History  . Marital status: Married    Spouse name: Not on file  . Number of children: Not on file  . Years of education: Not on file  . Highest education level: Not on file  Social Needs  . Financial resource strain: Not on file  . Food insecurity - worry: Not on file  . Food insecurity - inability: Not on file  . Transportation needs - medical: Not on file  . Transportation needs - non-medical: Not on file  Occupational History  . Not on file  Tobacco Use  . Smoking status: Former Research scientist (life sciences)  . Smokeless tobacco: Never Used  . Tobacco  comment: smoked in college  Substance and Sexual Activity  . Alcohol use: Yes    Comment: OCCASIONAL  . Drug use: No  . Sexual activity: No  Other Topics Concern  . Not on file  Social History Narrative  . Not on file    Family History  Problem Relation Age of Onset  . Heart disease Father   . Hypertension Father   . Heart attack Father   . Heart attack Paternal Uncle   . Heart attack Paternal Uncle   . Heart attack Paternal Uncle      Current Outpatient Medications:  .  amLODipine (NORVASC) 5 MG tablet, Take 1 tablet (5 mg total) by mouth daily., Disp: 30 tablet, Rfl: 0 .  Calcium Carbonate-Vitamin D (CALTRATE 600+D PO), Take 1 tablet by mouth daily. , Disp: , Rfl:  .  capecitabine (XELODA) 500 MG tablet, Take 1,500 mg by mouth 2 (two) times daily after a meal. Take 3 tablets (1,500 mg total) by mouth 2 (two) times daily after a meal for 14 days. And then take 1 week off, Disp: , Rfl:  .  Cholecalciferol 10000 units TABS, Take 1,000 Units by mouth daily. In the morning, Disp: ,  Rfl:  .  dexamethasone (DECADRON) 4 MG tablet, Take 2 tablets (8 mg total) by mouth daily. Start the day after chemotherapy for 2 days. Take with food., Disp: 30 tablet, Rfl: 1 .  diphenoxylate-atropine (LOMOTIL) 2.5-0.025 MG tablet, Take 1 tablet by mouth 4 (four) times daily as needed for diarrhea or loose stools., Disp: 60 tablet, Rfl: 0 .  LORazepam (ATIVAN) 0.5 MG tablet, Take 1 tablet (0.5 mg total) by mouth every 6 (six) hours as needed (Nausea or vomiting)., Disp: 30 tablet, Rfl: 0 .  ondansetron (ZOFRAN) 8 MG tablet, Take 1 tablet (8 mg total) by mouth 2 (two) times daily as needed for refractory nausea / vomiting. Start on day 3 after chemotherapy., Disp: 30 tablet, Rfl: 1 .  prochlorperazine (COMPAZINE) 10 MG tablet, Take 1 tablet (10 mg total) by mouth every 6 (six) hours as needed (Nausea or vomiting)., Disp: 30 tablet, Rfl: 1 .  urea (CARMOL) 10 % cream, Apply topically 3 (three) times daily as  needed. To skin areas, Disp: 71 g, Rfl: 0 .  vitamin B-12 (CYANOCOBALAMIN) 500 MCG tablet, Take 500 mcg by mouth daily. 1 gummie in the morning, Disp: , Rfl:  .  acetaminophen (TYLENOL) 500 MG tablet, Take 500 mg by mouth every 6 (six) hours as needed., Disp: , Rfl:  .  atorvastatin (LIPITOR) 10 MG tablet, Take 10 mg by mouth daily. In the morning, Disp: , Rfl:  .  azelastine (ASTELIN) 0.1 % nasal spray, instill 2 sprays into each nostril twice a day for 7 days then twice a day if needed congestion, Disp: , Rfl:  .  lidocaine-prilocaine (EMLA) cream, Apply to affected area once, Disp: 30 g, Rfl: 3 .  pantoprazole (PROTONIX) 40 MG tablet, Take 1 tablet (40 mg total) by mouth 2 (two) times daily. (Patient not taking: Reported on 07/19/2017), Disp: 28 tablet, Rfl: 0 .  tretinoin (RETIN-A) 0.1 % cream, Apply 1 application topically at bedtime as needed (SKIN DISCOLORATION). Uses every other night or so on face due to spot on nose that darkens, Disp: , Rfl:   Physical exam:  Vitals:   07/19/17 1038  BP: 140/83  Pulse: (!) 101  Resp: 18  Temp: 98.2 F (36.8 C)  TempSrc: Tympanic  Weight: 126 lb 6.4 oz (57.3 kg)  Height: 5' 2"  (1.575 m)   Physical Exam  Constitutional: She is oriented to person, place, and time and well-developed, well-nourished, and in no distress. She appears dehydrated.  HENT:  Head: Normocephalic and atraumatic.  Mouth/Throat: Oropharynx is clear and moist. Mucous membranes are pale and dry. Oral lesions present.    Eyes: Pupils are equal, round, and reactive to light.  Neck: Normal range of motion.  Cardiovascular: Normal rate, regular rhythm and normal heart sounds.  No murmur heard. Pulmonary/Chest: Effort normal and breath sounds normal. She has no wheezes.  Abdominal: Soft. Normal appearance and bowel sounds are normal. She exhibits no distension. There is no tenderness.  Musculoskeletal: Normal range of motion. She exhibits no edema.  Neurological: She is alert  and oriented to person, place, and time. Gait normal.  Skin: Skin is warm and dry. No rash noted.  Psychiatric: Mood, memory, affect and judgment normal.     CMP Latest Ref Rng & Units 07/19/2017  Glucose 65 - 99 mg/dL 135(H)  BUN 6 - 20 mg/dL 12  Creatinine 0.44 - 1.00 mg/dL 0.70  Sodium 135 - 145 mmol/L 131(L)  Potassium 3.5 - 5.1 mmol/L 4.0  Chloride 101 -  111 mmol/L 100(L)  CO2 22 - 32 mmol/L 23  Calcium 8.9 - 10.3 mg/dL 9.3  Total Protein 6.5 - 8.1 g/dL 6.9  Total Bilirubin 0.3 - 1.2 mg/dL 0.6  Alkaline Phos 38 - 126 U/L 68  AST 15 - 41 U/L 18  ALT 14 - 54 U/L 17   CBC Latest Ref Rng & Units 07/19/2017  WBC 3.6 - 11.0 K/uL 10.5  Hemoglobin 12.0 - 16.0 g/dL 12.4  Hematocrit 35.0 - 47.0 % 37.1  Platelets 150 - 440 K/uL 277    No images are attached to the encounter.  No results found.   Assessment and plan- Patient is a 82 y.o. female who presents with nausea, vomiting and diarrhea for the past 3 days.On initial assessment patient appears well sitting in the exam chair. She is afebrile. She is slightly tachycardic with a heart rate of 101. Blood pressure is stable. Lungs are clear and heart sounds are normal. Abdominal assessment does not reveal any abnormalities. Patient did not complain of pain during palpation of abdomen. Labs reveal hyponatremia with a sodium of 131. Kidney function is WNL. White count on the upper ends of normal at 10.5.  1. Will give patient 1 L normal saline today in clinic.  2. Nausea: Will give the patient 8 mg of Zofran IV during infusion. Encouraged patient to take antiemetics prophylactically. Specifically she worries about taking her Xeloda. Encouraged her to take Zofran 1 hour prior to her next dose. She was agreeable. Also asked her to collect a urine sample to rule out kidney infection. 3. Diarrhea: This could be due to several factors. Will collect a stool sample to rule out C. Difficile and perform a GI panel for other toxins. Per patient, she  will have to bring home and collect when she has a bowel movement. 4. Mouth sores: Will provide patient with a prescription for Magic mouthwash with lidocaine to see if this helps. 5. RTC as scheduled to see Dr. Janese Banks.    Visit Diagnosis 1. Non-intractable vomiting with nausea, unspecified vomiting type   2. Diarrhea, unspecified type   3. Cancer of ascending colon ([place])   4. Mouth sores     Patient expressed understanding and was in agreement with this plan. She also understands that She can call clinic at any time with any questions, concerns, or complaints.   Greater than 50% was spent in counseling and coordination of care with this patient including but not limited to discussion of the relevant topics above (See A&P) including, but not limited to diagnosis and management of acute and chronic medical conditions.    Faythe Casa, AGNP-C [facility] [place]- [phone] Pager- [phone] 07/19/2017 4:11 PM

## 2017-07-20 ENCOUNTER — Other Ambulatory Visit: Payer: Self-pay

## 2017-07-20 DIAGNOSIS — R112 Nausea with vomiting, unspecified: Secondary | ICD-10-CM

## 2017-07-20 DIAGNOSIS — Z5111 Encounter for antineoplastic chemotherapy: Secondary | ICD-10-CM | POA: Diagnosis not present

## 2017-07-20 LAB — GASTROINTESTINAL PANEL BY PCR, STOOL (REPLACES STOOL CULTURE)

## 2017-07-20 LAB — URINALYSIS, COMPLETE (UACMP) WITH MICROSCOPIC
Bilirubin Urine: NEGATIVE
GLUCOSE, UA: NEGATIVE mg/dL
Hgb urine dipstick: NEGATIVE
KETONES UR: NEGATIVE mg/dL
Leukocytes, UA: NEGATIVE
Nitrite: POSITIVE — AB
PH: 6 (ref 5.0–8.0)
Protein, ur: NEGATIVE mg/dL
SPECIFIC GRAVITY, URINE: 1.019 (ref 1.005–1.030)

## 2017-07-20 LAB — C DIFFICILE QUICK SCREEN W PCR REFLEX
C DIFFICILE (CDIFF) TOXIN: NEGATIVE
C Diff antigen: POSITIVE — AB

## 2017-07-20 LAB — CLOSTRIDIUM DIFFICILE BY PCR, REFLEXED: CDIFFPCR: NEGATIVE

## 2017-07-20 NOTE — Progress Notes (Signed)
Spoke with Dr. Janese Banks regarding this patient. Patient is likely having diarrhea from her chemotherapy. C-diff PCR negative. Blood counts stable. Will not treat with antibiotic at this time. Will continue to monitor. Encouraged Lomotil for diarrhea and Zofran for nausea.   Faythe Casa, NP 07/20/2017 2:27 PM

## 2017-07-21 ENCOUNTER — Telehealth: Payer: Self-pay | Admitting: *Deleted

## 2017-07-21 ENCOUNTER — Encounter: Payer: Self-pay | Admitting: Oncology

## 2017-07-21 ENCOUNTER — Inpatient Hospital Stay: Payer: PPO

## 2017-07-21 ENCOUNTER — Inpatient Hospital Stay: Payer: PPO | Attending: Oncology | Admitting: Oncology

## 2017-07-21 ENCOUNTER — Other Ambulatory Visit: Payer: Self-pay | Admitting: *Deleted

## 2017-07-21 VITALS — BP 131/84 | HR 118 | Temp 97.5°F | Resp 18 | Wt 124.0 lb

## 2017-07-21 DIAGNOSIS — C189 Malignant neoplasm of colon, unspecified: Secondary | ICD-10-CM

## 2017-07-21 DIAGNOSIS — R530 Neoplastic (malignant) related fatigue: Secondary | ICD-10-CM | POA: Diagnosis not present

## 2017-07-21 DIAGNOSIS — D509 Iron deficiency anemia, unspecified: Secondary | ICD-10-CM | POA: Insufficient documentation

## 2017-07-21 DIAGNOSIS — C182 Malignant neoplasm of ascending colon: Secondary | ICD-10-CM | POA: Diagnosis not present

## 2017-07-21 DIAGNOSIS — Z87891 Personal history of nicotine dependence: Secondary | ICD-10-CM | POA: Insufficient documentation

## 2017-07-21 DIAGNOSIS — E876 Hypokalemia: Secondary | ICD-10-CM | POA: Insufficient documentation

## 2017-07-21 DIAGNOSIS — K521 Toxic gastroenteritis and colitis: Secondary | ICD-10-CM | POA: Diagnosis not present

## 2017-07-21 DIAGNOSIS — R197 Diarrhea, unspecified: Secondary | ICD-10-CM

## 2017-07-21 DIAGNOSIS — R112 Nausea with vomiting, unspecified: Secondary | ICD-10-CM | POA: Diagnosis not present

## 2017-07-21 DIAGNOSIS — E860 Dehydration: Secondary | ICD-10-CM | POA: Insufficient documentation

## 2017-07-21 DIAGNOSIS — Z79899 Other long term (current) drug therapy: Secondary | ICD-10-CM | POA: Insufficient documentation

## 2017-07-21 DIAGNOSIS — R8290 Unspecified abnormal findings in urine: Secondary | ICD-10-CM

## 2017-07-21 DIAGNOSIS — T50905A Adverse effect of unspecified drugs, medicaments and biological substances, initial encounter: Secondary | ICD-10-CM

## 2017-07-21 LAB — BASIC METABOLIC PANEL
ANION GAP: 7 (ref 5–15)
BUN: 13 mg/dL (ref 6–20)
CHLORIDE: 103 mmol/L (ref 101–111)
CO2: 23 mmol/L (ref 22–32)
Calcium: 9.2 mg/dL (ref 8.9–10.3)
Creatinine, Ser: 0.67 mg/dL (ref 0.44–1.00)
GFR calc non Af Amer: >60 mL/min (ref >60)
Glucose, Bld: 133 mg/dL — ABNORMAL HIGH (ref 65–99)
Potassium: 3.6 mmol/L (ref 3.5–5.1)
Sodium: 133 mmol/L — ABNORMAL LOW (ref 135–145)

## 2017-07-21 MED ORDER — DEXAMETHASONE SODIUM PHOSPHATE 10 MG/ML IJ SOLN
10.0000 mg | Freq: Once | INTRAMUSCULAR | Status: AC
  Administered 2017-07-21: 10 mg via INTRAVENOUS
  Filled 2017-07-21: qty 1

## 2017-07-21 MED ORDER — ONDANSETRON HCL 4 MG/2ML IJ SOLN
4.0000 mg | Freq: Once | INTRAMUSCULAR | Status: AC
  Administered 2017-07-21: 4 mg via INTRAVENOUS
  Filled 2017-07-21: qty 2

## 2017-07-21 MED ORDER — DEXAMETHASONE SODIUM PHOSPHATE 10 MG/ML IJ SOLN: 10.0000 mg | Freq: Once | INTRAMUSCULAR | Status: DC

## 2017-07-21 MED ORDER — SODIUM CHLORIDE 0.9 % IV SOLN
Freq: Once | INTRAVENOUS | Status: AC
  Administered 2017-07-21: 15:00:00 via INTRAVENOUS
  Filled 2017-07-21: qty 1000

## 2017-07-21 NOTE — Telephone Encounter (Signed)
Patient called and reports that she has been so sick since taking her last pill, She has diarrhea and vomiting .  Dr Janese Banks wants to see patient today and discuss treatment and possibly give IV fluids.   Appointment offered for 130, she states she will try to be here as she is having to run to bathroom frequently with diarrhea and is vomiting.

## 2017-07-21 NOTE — Progress Notes (Signed)
Pt in for follow up, still experiencing nausea, vomiting and diarrhea.  Reports taking last xeloda at 1030pm last night.

## 2017-07-21 NOTE — Progress Notes (Signed)
Hematology/Oncology Consult note [facility]  Telephone:([phone] [phone]  Patient Care Team: Adin Hector, MD as PCP - General (Internal Medicine) Bary Castilla Forest Gleason, MD (General Surgery)   Name of the patient: Sara Smith  [id number]  1936/02/17   Date of visit: 07/21/17  Diagnosis- 1. Newly diagnosed Stage IIIB pT3 pN1a cM0colon cancer 2. Iron deficiency anemia   Chief complaint/ Reason for visit- acute care visit for nausea, vomiting and diarrhea  Heme/Onc history: Patient is a 82 year old female who was initially seen by me in May 2018. She was referred to me for her microcytic anemia which was found on her labs in April 2018 when her hemoglobin dropped down to 7 from a baseline of 12-6 months prior. Prior to that she has never had an EGD or colonoscopy in the past. Stool occult cards checked by PCP Dr. Ciro Backer negative. Labs were consistent with iron deficiency anemia and patient received 2 doses of Feraheme. She was also found to be positive for H pylori and was treated with triple antibiotic therapy followed by repeat stool test which was negative.Celiac disease panel also revealed an elevated TTG. Urinalysis was negative for hematuria. She was also referred to [facility] GI (after she did not want to see [facility]) during her initial visit.  With regards to her iron deficiency anemia:Her hemoglobin improved to 10after 2 doses of Feraheme. She continued to have iron deficiency but did not wish to take IV iron and continue to take oral iron. Hemoglobin in October 2018 rose to 12 with continued iron deficiency noted on iron studies. Patient again did not wish to take IV iron and stayed on oral iron  With regards to her GI workup-she was referred to GI in May 2018and seen by them in June 2018. Upper endoscopy was scheduled in October 2018. She was supposed to undergo both EGD and colonoscopy. However  patient complained of constipation due to iron pills and her colonoscopy was canceled in October 2018 and rescheduled to April 2019. Upper endoscopy was unremarkable and 2 biopsies were taken from the duodenum for celiac disease which were negative  Her hemoglobin then dropped from 12to9.9when checked by Dr. Caryl Comes in December 2018 and a request was made to move her colonoscopy up. She therefore underwent colonoscopy on 12/18/18which showed a fungating and infiltrative partially obstructing large mass in the mid ascending colon measuring about 4 cm. Pathology showed invasive adenocarcinoma. Moderately differentiated  Patient underwent right hemicolectomy on 06/02/2017 which showed invasive adenocarcinoma 3.1 cm, grade 2. Tumor invades through the muscularis propria into pericolonic colorectal tissue. Proximal and distal margins were negative but radial margin was positive. LV I was present. PNI not identified. 2 tumor deposits were present. 1 out of 36 lymph nodes was positive for metastatic disease. Pathological stage pT3pN1a. MSI stable  risks and benefits of 3 months of CAPEOX versus 6 months of FOLFOX discussed and patient opted to go for Capeox  Interval history- patient completed last dose of cycle 1 xeloda 2 weeks on and 1 week off yesterday. She was just seen 2 days back for her diarrhea and was given IV fluids. Patient reports that nausea and vomiting has been more of an issue more than the diarrhea. She has to go to the bathroom 3-4 times a day but goes very little each time. Denies any profuse diarrhea. She has been  Trying to keep up with her fluid intake. Thought of taking xeloda makes her nauseous  ECOG PS- 0 Pain scale- 0  Review of systems- Review of Systems  Constitutional: Negative for chills, fever, malaise/fatigue and weight loss.  HENT: Negative for congestion, ear discharge and nosebleeds.   Eyes: Negative for blurred vision.  Respiratory: Negative for cough,  hemoptysis, sputum production, shortness of breath and wheezing.   Cardiovascular: Negative for chest pain, palpitations, orthopnea and claudication.  Gastrointestinal: Positive for diarrhea, nausea and vomiting. Negative for abdominal pain, blood in stool, constipation, heartburn and melena.  Genitourinary: Negative for dysuria, flank pain, frequency, hematuria and urgency.  Musculoskeletal: Negative for back pain, joint pain and myalgias.  Skin: Negative for rash.  Neurological: Negative for dizziness, tingling, focal weakness, seizures, weakness and headaches.  Endo/Heme/Allergies: Does not bruise/bleed easily.  Psychiatric/Behavioral: Negative for depression and suicidal ideas. The patient does not have insomnia.     Allergies  Allergen Reactions  . Actonel [Risedronate Sodium] Nausea And Vomiting    Dizziness. Given 10 years ago  . Calcitonin ([place]) Nausea And Vomiting    dizziness  . Ciprofloxacin Nausea Only    Very sick within 2 days of taking antibiotic     Past Medical History:  Diagnosis Date  . Cancer ([place]) 05/09/2017   INVASIVE ADENOCARCINOMA, MODERATELY DIFFERENTIATED, ascending colon  . Dysrhythmia    tachycardia occasionally  . H/O herpes zoster   . History of Helicobacter pylori infection   . Hyperlipidemia   . Hypertension   . Iron deficiency anemia    was on feraheme but stopped due to side effects  . Leukopenia   . Osteoarthritis involving multiple joints on both sides of body   . Osteopenia   . Subacute thyroiditis   . Tachycardia      Past Surgical History:  Procedure Laterality Date  . CHOLECYSTECTOMY  2003   Dr Nicholes Stairs  . COLON RESECTION Right 06/02/2017   Laparoscopically assisted right colectomy.  pT3 pN1a, No loss of nuclear expression of MMR proteins.  Surgeon: Robert Bellow, MD;  Location: [facility] ORS;  Service: General;  Laterality: Right;  . COLONOSCOPY WITH PROPOFOL N/A 05/09/2017   Procedure: COLONOSCOPY WITH PROPOFOL;  Surgeon:  Lollie Sails, MD;  Location: [facility] ENDOSCOPY;  Service: Endoscopy;  Laterality: N/A;  . ESOPHAGOGASTRODUODENOSCOPY (EGD) WITH PROPOFOL N/A 03/21/2017   Procedure: ESOPHAGOGASTRODUODENOSCOPY (EGD) WITH PROPOFOL;  Surgeon: Lollie Sails, MD;  Location: [facility] ENDOSCOPY;  Service: Endoscopy;  Laterality: N/A;  . EYE SURGERY Left    cataract surgery  . hyperlipedemia N/A   . irregular HR      Social History   Socioeconomic History  . Marital status: Married    Spouse name: Not on file  . Number of children: Not on file  . Years of education: Not on file  . Highest education level: Not on file  Social Needs  . Financial resource strain: Not on file  . Food insecurity - worry: Not on file  . Food insecurity - inability: Not on file  . Transportation needs - medical: Not on file  . Transportation needs - non-medical: Not on file  Occupational History  . Not on file  Tobacco Use  . Smoking status: Former Research scientist (life sciences)  . Smokeless tobacco: Never Used  . Tobacco comment: smoked in college  Substance and Sexual Activity  . Alcohol use: Yes    Comment: OCCASIONAL  . Drug use: No  . Sexual activity: No  Other Topics Concern  . Not on file  Social History Narrative  . Not on file  Family History  Problem Relation Age of Onset  . Heart disease Father   . Hypertension Father   . Heart attack Father   . Heart attack Paternal Uncle   . Heart attack Paternal Uncle   . Heart attack Paternal Uncle      Current Outpatient Medications:  .  acetaminophen (TYLENOL) 500 MG tablet, Take 500 mg by mouth every 6 (six) hours as needed., Disp: , Rfl:  .  amLODipine (NORVASC) 5 MG tablet, Take 1 tablet (5 mg total) by mouth daily., Disp: 30 tablet, Rfl: 0 .  atorvastatin (LIPITOR) 10 MG tablet, TAKE 1 TABLET PO ONCE DAILY, Disp: , Rfl: 2 .  azelastine (ASTELIN) 0.1 % nasal spray, instill 2 sprays into each nostril twice a day for 7 days then twice a day if needed congestion, Disp: , Rfl:    .  Calcium Carbonate-Vitamin D (CALTRATE 600+D PO), Take 1 tablet by mouth daily. , Disp: , Rfl:  .  capecitabine (XELODA) 500 MG tablet, Take 1,500 mg by mouth 2 (two) times daily after a meal. Take 3 tablets (1,500 mg total) by mouth 2 (two) times daily after a meal for 14 days. And then take 1 week off, Disp: , Rfl:  .  Cholecalciferol 10000 units TABS, Take 1,000 Units by mouth daily. In the morning, Disp: , Rfl:  .  dexamethasone (DECADRON) 4 MG tablet, Take 2 tablets (8 mg total) by mouth daily. Start the day after chemotherapy for 2 days. Take with food., Disp: 30 tablet, Rfl: 1 .  diphenoxylate-atropine (LOMOTIL) 2.5-0.025 MG tablet, Take 1 tablet by mouth 4 (four) times daily as needed for diarrhea or loose stools., Disp: 60 tablet, Rfl: 0 .  lidocaine-prilocaine (EMLA) cream, Apply to affected area once, Disp: 30 g, Rfl: 3 .  LORazepam (ATIVAN) 0.5 MG tablet, Take 1 tablet (0.5 mg total) by mouth every 6 (six) hours as needed (Nausea or vomiting)., Disp: 30 tablet, Rfl: 0 .  ondansetron (ZOFRAN) 8 MG tablet, Take 1 tablet (8 mg total) by mouth 2 (two) times daily as needed for refractory nausea / vomiting. Start on day 3 after chemotherapy., Disp: 30 tablet, Rfl: 1 .  pantoprazole (PROTONIX) 40 MG tablet, Take 1 tablet (40 mg total) by mouth 2 (two) times daily., Disp: 28 tablet, Rfl: 0 .  prochlorperazine (COMPAZINE) 10 MG tablet, Take 1 tablet (10 mg total) by mouth every 6 (six) hours as needed (Nausea or vomiting)., Disp: 30 tablet, Rfl: 1 .  tretinoin (RETIN-A) 0.1 % cream, Apply 1 application topically at bedtime as needed (SKIN DISCOLORATION). Uses every other night or so on face due to spot on nose that darkens, Disp: , Rfl:  .  urea (CARMOL) 10 % cream, Apply topically 3 (three) times daily as needed. To skin areas, Disp: 71 g, Rfl: 0 .  vitamin B-12 (CYANOCOBALAMIN) 500 MCG tablet, Take 500 mcg by mouth daily. 1 gummie in the morning, Disp: , Rfl:  .  atorvastatin (LIPITOR) 10 MG  tablet, Take 10 mg by mouth daily. In the morning, Disp: , Rfl:   Physical exam:  Vitals:   07/21/17 1352  BP: 131/84  Pulse: (!) 118  Resp: 18  Temp: (!) 97.5 F (36.4 C)  TempSrc: Tympanic  Weight: 124 lb (56.2 kg)   Physical Exam  Constitutional: She is oriented to person, place, and time and well-developed, well-nourished, and in no distress.  HENT:  Head: Normocephalic and atraumatic.  Eyes: EOM are normal. Pupils are equal,  round, and reactive to light.  Neck: Normal range of motion.  Cardiovascular: Regular rhythm and normal heart sounds.  tachycardic  Pulmonary/Chest: Effort normal and breath sounds normal.  Abdominal: Soft. Bowel sounds are normal.  Neurological: She is alert and oriented to person, place, and time.  Skin: Skin is warm and dry.  Palmar erythema     CMP Latest Ref Rng & Units 07/21/2017  Glucose 65 - 99 mg/dL 133(H)  BUN 6 - 20 mg/dL 13  Creatinine 0.44 - 1.00 mg/dL 0.67  Sodium 135 - 145 mmol/L 133(L)  Potassium 3.5 - 5.1 mmol/L 3.6  Chloride 101 - 111 mmol/L 103  CO2 22 - 32 mmol/L 23  Calcium 8.9 - 10.3 mg/dL 9.2  Total Protein 6.5 - 8.1 g/dL -  Total Bilirubin 0.3 - 1.2 mg/dL -  Alkaline Phos 38 - 126 U/L -  AST 15 - 41 U/L -  ALT 14 - 54 U/L -   CBC Latest Ref Rng & Units 07/19/2017  WBC 3.6 - 11.0 K/uL 10.5  Hemoglobin 12.0 - 16.0 g/dL 12.4  Hematocrit 35.0 - 47.0 % 37.1  Platelets 150 - 440 K/uL 277      Assessment and plan- Patient is a 82 y.o. female withadenocarcinoma of the ascending colon at least stage IIIB pT3 pN1a cM0status post right hemicolectomy s/p cycle 1 of [place] OX  Patients has had significant nausea/ vomiting towards the end of her cycle 1 due to xeloda. Also has diarrhea but that does not appear to be severe. C diff antigen positive but toxin negatiev and pcr negative. She therefore does not have C diff. She will continue prn imodium and lomotil for her diarrhea. She has completed cycle 1 and she gets a week off at  this time and hopefully her diarrhea should improve in the next few days. We will call her early next week  Nausea/ vomiting secondary to xeloda. xeloda on hold. Will give 1L IVF NS today. Also give 10 mg IV decadron with 4 mg IV zofran. She will let us know if she needs more IVF next week. Will start 10 mg QHS zyprexa for 10 days in addition to prn zofran PO for home nausea meds.  Colon cancer- discussed that she is already taking reduced dose of xeloda at 1000 mg/meter square from 1250 mg/meter square BID. Further lower doses may not cause nausea/ vomiting or diarrhea but we may reduce the fefctiveness of this regimen. As she is unable to tolerate xeloda well, doing FOLFOX for 10 more cycles is an option. She would like tot hink about tit and let us know on Monday. If she opts for FOLFOX, we will get in touch with Dr. Bary Castilla regarding port placement  UA was negative for wbcs and LE but positive for nitrite. Patient does not report any urinary symptoms- no fever, dysuria or burning. Urine cx shows citrobacter. Given absence of UTI symptoms, I will hold off on antibiotics at this time. Repeat urine sample on Monday. Also she was cdiff antigen positive (potential carrier). Would therefore hold off on antibiotics  I will move her chemo to 08/12/2017- cbc, cmp and either continue capeox at lower dose versus folfox based on patients wishes   Visit Diagnosis 1. Cancer of ascending colon ([place])   2. Drug-induced diarrhea   3. Drug-induced nausea and vomiting      Dr. Randa Evens, MD, MPH [place] [facility] Pager- [phone] 07/21/2017 2:10 PM

## 2017-07-21 NOTE — Telephone Encounter (Signed)
Called husband and let him know that pt needed nystatin powder to put under her breast on right side . He states [facility] will send him a message to let him know when to pick up

## 2017-07-21 NOTE — Addendum Note (Signed)
Addended by: Luella Cook on: 07/21/2017 03:53 PM   Modules accepted: Orders

## 2017-07-22 LAB — URINE CULTURE: Culture: >=100000 — AB

## 2017-07-24 ENCOUNTER — Other Ambulatory Visit: Payer: Self-pay

## 2017-07-24 DIAGNOSIS — K521 Toxic gastroenteritis and colitis: Secondary | ICD-10-CM

## 2017-07-24 DIAGNOSIS — C182 Malignant neoplasm of ascending colon: Secondary | ICD-10-CM | POA: Diagnosis not present

## 2017-07-24 LAB — URINALYSIS, COMPLETE (UACMP) WITH MICROSCOPIC
BILIRUBIN URINE: NEGATIVE
Glucose, UA: NEGATIVE mg/dL
Hgb urine dipstick: NEGATIVE
KETONES UR: 20 mg/dL — AB
LEUKOCYTES UA: NEGATIVE
Nitrite: NEGATIVE
Protein, ur: NEGATIVE mg/dL
Specific Gravity, Urine: 1.03 (ref 1.005–1.030)
pH: 6 (ref 5.0–8.0)

## 2017-07-25 ENCOUNTER — Encounter: Payer: Self-pay | Admitting: Oncology

## 2017-07-25 ENCOUNTER — Telehealth: Payer: Self-pay | Admitting: *Deleted

## 2017-07-25 ENCOUNTER — Ambulatory Visit: Payer: PPO

## 2017-07-25 ENCOUNTER — Inpatient Hospital Stay: Payer: PPO

## 2017-07-25 ENCOUNTER — Inpatient Hospital Stay (HOSPITAL_BASED_OUTPATIENT_CLINIC_OR_DEPARTMENT_OTHER): Payer: PPO | Admitting: Oncology

## 2017-07-25 ENCOUNTER — Encounter: Payer: Self-pay | Admitting: Internal Medicine

## 2017-07-25 ENCOUNTER — Other Ambulatory Visit: Payer: Self-pay | Admitting: *Deleted

## 2017-07-25 ENCOUNTER — Other Ambulatory Visit: Payer: Self-pay

## 2017-07-25 ENCOUNTER — Inpatient Hospital Stay
Admission: AD | Admit: 2017-07-25 | Discharge: 2017-08-21 | DRG: 640 | Disposition: E | Payer: PPO | Source: Ambulatory Visit | Attending: Internal Medicine | Admitting: Internal Medicine

## 2017-07-25 ENCOUNTER — Encounter: Payer: PPO | Admitting: Nurse Practitioner

## 2017-07-25 VITALS — BP 128/87 | HR 123 | Temp 97.1°F | Resp 18

## 2017-07-25 DIAGNOSIS — Z79899 Other long term (current) drug therapy: Secondary | ICD-10-CM | POA: Diagnosis not present

## 2017-07-25 DIAGNOSIS — K5903 Drug induced constipation: Secondary | ICD-10-CM | POA: Diagnosis not present

## 2017-07-25 DIAGNOSIS — Z87891 Personal history of nicotine dependence: Secondary | ICD-10-CM

## 2017-07-25 DIAGNOSIS — Z9049 Acquired absence of other specified parts of digestive tract: Secondary | ICD-10-CM

## 2017-07-25 DIAGNOSIS — Z881 Allergy status to other antibiotic agents status: Secondary | ICD-10-CM

## 2017-07-25 DIAGNOSIS — Z85038 Personal history of other malignant neoplasm of large intestine: Secondary | ICD-10-CM

## 2017-07-25 DIAGNOSIS — R530 Neoplastic (malignant) related fatigue: Secondary | ICD-10-CM

## 2017-07-25 DIAGNOSIS — Z6823 Body mass index (BMI) 23.0-23.9, adult: Secondary | ICD-10-CM | POA: Diagnosis not present

## 2017-07-25 DIAGNOSIS — C189 Malignant neoplasm of colon, unspecified: Secondary | ICD-10-CM | POA: Diagnosis not present

## 2017-07-25 DIAGNOSIS — C182 Malignant neoplasm of ascending colon: Secondary | ICD-10-CM

## 2017-07-25 DIAGNOSIS — E860 Dehydration: Secondary | ICD-10-CM | POA: Diagnosis not present

## 2017-07-25 DIAGNOSIS — T451X5A Adverse effect of antineoplastic and immunosuppressive drugs, initial encounter: Secondary | ICD-10-CM | POA: Diagnosis present

## 2017-07-25 DIAGNOSIS — R197 Diarrhea, unspecified: Secondary | ICD-10-CM | POA: Diagnosis not present

## 2017-07-25 DIAGNOSIS — R112 Nausea with vomiting, unspecified: Secondary | ICD-10-CM | POA: Diagnosis not present

## 2017-07-25 DIAGNOSIS — E061 Subacute thyroiditis: Secondary | ICD-10-CM | POA: Diagnosis present

## 2017-07-25 DIAGNOSIS — E43 Unspecified severe protein-calorie malnutrition: Secondary | ICD-10-CM | POA: Diagnosis present

## 2017-07-25 DIAGNOSIS — Z8249 Family history of ischemic heart disease and other diseases of the circulatory system: Secondary | ICD-10-CM | POA: Diagnosis not present

## 2017-07-25 DIAGNOSIS — D509 Iron deficiency anemia, unspecified: Secondary | ICD-10-CM | POA: Diagnosis not present

## 2017-07-25 DIAGNOSIS — E871 Hypo-osmolality and hyponatremia: Secondary | ICD-10-CM | POA: Diagnosis present

## 2017-07-25 DIAGNOSIS — K521 Toxic gastroenteritis and colitis: Secondary | ICD-10-CM

## 2017-07-25 DIAGNOSIS — M1990 Unspecified osteoarthritis, unspecified site: Secondary | ICD-10-CM | POA: Diagnosis present

## 2017-07-25 DIAGNOSIS — I10 Essential (primary) hypertension: Secondary | ICD-10-CM | POA: Diagnosis not present

## 2017-07-25 DIAGNOSIS — Z888 Allergy status to other drugs, medicaments and biological substances status: Secondary | ICD-10-CM | POA: Diagnosis not present

## 2017-07-25 DIAGNOSIS — M8580 Other specified disorders of bone density and structure, unspecified site: Secondary | ICD-10-CM | POA: Diagnosis not present

## 2017-07-25 DIAGNOSIS — E876 Hypokalemia: Secondary | ICD-10-CM

## 2017-07-25 DIAGNOSIS — L270 Generalized skin eruption due to drugs and medicaments taken internally: Secondary | ICD-10-CM | POA: Diagnosis present

## 2017-07-25 DIAGNOSIS — L271 Localized skin eruption due to drugs and medicaments taken internally: Secondary | ICD-10-CM | POA: Diagnosis not present

## 2017-07-25 DIAGNOSIS — E785 Hyperlipidemia, unspecified: Secondary | ICD-10-CM | POA: Diagnosis not present

## 2017-07-25 DIAGNOSIS — Z66 Do not resuscitate: Secondary | ICD-10-CM | POA: Diagnosis present

## 2017-07-25 LAB — CBC WITH DIFFERENTIAL/PLATELET
Basophils Absolute: 0 10*3/uL (ref 0–0.1)
Basophils Relative: 0 %
EOS ABS: 0 10*3/uL (ref 0–0.7)
EOS PCT: 0 %
HEMATOCRIT: 44.6 % (ref 35.0–47.0)
HEMOGLOBIN: 14.7 g/dL (ref 12.0–16.0)
LYMPHS ABS: 0.8 10*3/uL — AB (ref 1.0–3.6)
Lymphocytes Relative: 14 %
MCH: 27.7 pg (ref 26.0–34.0)
MCHC: 32.9 g/dL (ref 32.0–36.0)
MCV: 84.2 fL (ref 80.0–100.0)
MONOS PCT: 22 %
Monocytes Absolute: 1.3 10*3/uL — ABNORMAL HIGH (ref 0.2–0.9)
Neutro Abs: 3.5 10*3/uL (ref 1.4–6.5)
Neutrophils Relative %: 64 %
Platelets: 430 10*3/uL (ref 150–440)
RBC: 5.3 MIL/uL — ABNORMAL HIGH (ref 3.80–5.20)
RDW: 27.8 % — ABNORMAL HIGH (ref 11.5–14.5)
WBC: 5.6 10*3/uL (ref 3.6–11.0)
nRBC: 1 /100 WBC — ABNORMAL HIGH

## 2017-07-25 LAB — COMPREHENSIVE METABOLIC PANEL
ALBUMIN: 3.4 g/dL — AB (ref 3.5–5.0)
ALT: 11 U/L — AB (ref 14–54)
AST: 19 U/L (ref 15–41)
Alkaline Phosphatase: 59 U/L (ref 38–126)
Anion gap: 11 (ref 5–15)
BUN: 26 mg/dL — AB (ref 6–20)
CHLORIDE: 103 mmol/L (ref 101–111)
CO2: 18 mmol/L — AB (ref 22–32)
CREATININE: 1.03 mg/dL — AB (ref 0.44–1.00)
Calcium: 8.8 mg/dL — ABNORMAL LOW (ref 8.9–10.3)
GFR calc Af Amer: 58 mL/min — ABNORMAL LOW (ref >60)
GFR calc non Af Amer: 50 mL/min — ABNORMAL LOW (ref >60)
GLUCOSE: 166 mg/dL — AB (ref 65–99)
POTASSIUM: 2.9 mmol/L — AB (ref 3.5–5.1)
SODIUM: 132 mmol/L — AB (ref 135–145)
Total Bilirubin: 0.6 mg/dL (ref 0.3–1.2)
Total Protein: 6.1 g/dL — ABNORMAL LOW (ref 6.5–8.1)

## 2017-07-25 LAB — C DIFFICILE QUICK SCREEN W PCR REFLEX
C DIFFICILE (CDIFF) TOXIN: NEGATIVE
C DIFFICLE (CDIFF) ANTIGEN: NEGATIVE
C Diff interpretation: NOT DETECTED

## 2017-07-25 LAB — GASTROINTESTINAL PANEL BY PCR, STOOL (REPLACES STOOL CULTURE)
ADENOVIRUS F40/41: NOT DETECTED
ASTROVIRUS: NOT DETECTED
CRYPTOSPORIDIUM: NOT DETECTED
CYCLOSPORA CAYETANENSIS: NOT DETECTED
Campylobacter species: NOT DETECTED
ENTEROAGGREGATIVE E COLI (EAEC): NOT DETECTED
ENTEROPATHOGENIC E COLI (EPEC): NOT DETECTED
Entamoeba histolytica: NOT DETECTED
Enterotoxigenic E coli (ETEC): NOT DETECTED
Giardia lamblia: NOT DETECTED
Norovirus GI/GII: NOT DETECTED
Plesimonas shigelloides: NOT DETECTED
Rotavirus A: NOT DETECTED
Salmonella species: NOT DETECTED
Sapovirus (I, II, IV, and V): NOT DETECTED
Shiga like toxin producing E coli (STEC): NOT DETECTED
Shigella/Enteroinvasive E coli (EIEC): NOT DETECTED
VIBRIO SPECIES: NOT DETECTED
Vibrio cholerae: NOT DETECTED
YERSINIA ENTEROCOLITICA: NOT DETECTED

## 2017-07-25 LAB — MAGNESIUM: MAGNESIUM: 2.3 mg/dL (ref 1.7–2.4)

## 2017-07-25 MED ORDER — ENOXAPARIN SODIUM 40 MG/0.4ML ~~LOC~~ SOLN
40.0000 mg | SUBCUTANEOUS | Status: DC
  Administered 2017-07-25 – 2017-07-31 (×7): 40 mg via SUBCUTANEOUS
  Filled 2017-07-25 (×7): qty 0.4

## 2017-07-25 MED ORDER — VITAMIN D 1000 UNITS PO TABS
1000.0000 [IU] | ORAL_TABLET | Freq: Every day | ORAL | Status: DC
Start: 2017-07-26 — End: 2017-08-01
  Filled 2017-07-25: qty 3
  Filled 2017-07-25 (×2): qty 1
  Filled 2017-07-25: qty 3

## 2017-07-25 MED ORDER — SODIUM CHLORIDE 0.9 % IV SOLN: 10.0000 mg | Freq: Once | INTRAVENOUS | Status: DC

## 2017-07-25 MED ORDER — CALCIUM CARBONATE-VITAMIN D 500-200 MG-UNIT PO TABS
ORAL_TABLET | Freq: Every day | ORAL | Status: DC
  Filled 2017-07-25 (×3): qty 1

## 2017-07-25 MED ORDER — ACETAMINOPHEN 325 MG PO TABS: 650.0000 mg | ORAL_TABLET | Freq: Four times a day (QID) | ORAL | Status: DC | PRN

## 2017-07-25 MED ORDER — ONDANSETRON HCL 4 MG/2ML IJ SOLN
4.0000 mg | Freq: Four times a day (QID) | INTRAMUSCULAR | Status: DC | PRN
  Administered 2017-07-25 – 2017-07-31 (×11): 4 mg via INTRAVENOUS
  Filled 2017-07-25 (×11): qty 2

## 2017-07-25 MED ORDER — DEXAMETHASONE SODIUM PHOSPHATE 10 MG/ML IJ SOLN
10.0000 mg | Freq: Once | INTRAMUSCULAR | Status: AC
  Administered 2017-07-25: 10 mg via INTRAVENOUS
  Filled 2017-07-25: qty 1

## 2017-07-25 MED ORDER — ATORVASTATIN CALCIUM 20 MG PO TABS
10.0000 mg | ORAL_TABLET | Freq: Every day | ORAL | Status: DC
  Filled 2017-07-25 (×3): qty 1

## 2017-07-25 MED ORDER — POTASSIUM CHLORIDE 20 MEQ/100ML IV SOLN
10.0000 meq | Freq: Once | INTRAVENOUS | Status: AC
  Administered 2017-07-25: 10 meq via INTRAVENOUS

## 2017-07-25 MED ORDER — ACETAMINOPHEN 650 MG RE SUPP: 650.0000 mg | Freq: Four times a day (QID) | RECTAL | Status: DC | PRN

## 2017-07-25 MED ORDER — ONDANSETRON HCL 4 MG/2ML IJ SOLN
8.0000 mg | Freq: Once | INTRAMUSCULAR | Status: AC
  Administered 2017-07-25: 8 mg via INTRAMUSCULAR
  Filled 2017-07-25: qty 4

## 2017-07-25 MED ORDER — SODIUM CHLORIDE 0.9 % IV SOLN
INTRAVENOUS | Status: DC
  Administered 2017-07-25: 16:00:00 via INTRAVENOUS
  Filled 2017-07-25 (×2): qty 1000

## 2017-07-25 MED ORDER — ATORVASTATIN CALCIUM 20 MG PO TABS: 10.0000 mg | ORAL_TABLET | Freq: Every evening | ORAL | Status: DC

## 2017-07-25 MED ORDER — ONDANSETRON HCL 4 MG PO TABS: 4.0000 mg | ORAL_TABLET | Freq: Four times a day (QID) | ORAL | Status: DC | PRN

## 2017-07-25 MED ORDER — CAPECITABINE 500 MG PO TABS: 1500.0000 mg | ORAL_TABLET | Freq: Two times a day (BID) | ORAL | Status: DC

## 2017-07-25 MED ORDER — POTASSIUM CHLORIDE IN NACL 20-0.9 MEQ/L-% IV SOLN
INTRAVENOUS | Status: DC
  Administered 2017-07-25 – 2017-07-31 (×13): via INTRAVENOUS
  Filled 2017-07-25 (×20): qty 1000

## 2017-07-25 NOTE — Telephone Encounter (Signed)
Per Dr. Elroy Channel request, called to check on patient. Spoke with patient's husband and was told that patient has been having nausea, vomiting and diarrhea since Friday, and has not been eating or drinking. Told patient's husband to bring her here this afternoon to be evaluated and to get possible fluids. He said he didn't know if she could make it here because she is so sick. I told him if he can't get her here, to call 911 and have EMS to the ED. Husband will discuss with patient and let us know.     dhs

## 2017-07-25 NOTE — Telephone Encounter (Signed)
Spoke with patient's husband and he is trying to get her up and ready to be seen here in the clinic at 1:30.       dhs

## 2017-07-25 NOTE — Progress Notes (Addendum)
Hematology/Oncology Consult note [facility]  Telephone:([phone] [phone]  Patient Care Team: Adin Hector, MD as PCP - General (Internal Medicine) Bary Castilla Forest Gleason, MD (General Surgery)   Name of the patient: Sara Smith  [id number]  May 20, 1936   Date of visit: 07/29/2017  Diagnosis-  1. Newly diagnosed Stage IIIB pT3 pN1a cM0colon cancer 2. Iron deficiency anemia  Chief complaint/ Reason for visit- acute care visit for nausea, vomiting and diarrhea  Heme/Onc history: Patient is a 82 year old female who was initially seen by me in May 2018. She was referred to me for her microcytic anemia which was found on her labs in April 2018 when her hemoglobin dropped down to 7 from a baseline of 12-6 months prior. Prior to that she has never had an EGD or colonoscopy in the past. Stool occult cards checked by PCP Dr. Ciro Backer negative. Labs were consistent with iron deficiency anemia and patient received 2 doses of Feraheme. She was also found to be positive for H pylori and was treated with triple antibiotic therapy followed by repeat stool test which was negative.Celiac disease panel also revealed an elevated TTG. Urinalysis was negative for hematuria. She was also referred to [facility] GI (after she did not want to see [place] [facility]) during her initial visit.  With regards to her iron deficiency anemia:Her hemoglobin improved to 10after 2 doses of Feraheme. She continued to have iron deficiency but did not wish to take IV iron and continue to take oral iron. Hemoglobin in October 2018 rose to 12 with continued iron deficiency noted on iron studies. Patient again did not wish to take IV iron and stayed on oral iron  With regards to her GI workup-she was referred to GI in May 2018and seen by them in June 2018. Upper endoscopy was scheduled in October 2018. She was supposed to undergo both EGD and colonoscopy. However  patient complained of constipation due to iron pills and her colonoscopy was canceled in October 2018 and rescheduled to April 2019. Upper endoscopy was unremarkable and 2 biopsies were taken from the duodenum for celiac disease which were negative  Her hemoglobin then dropped from 12to9.9when checked by Dr. Caryl Comes in December 2018 and a request was made to move her colonoscopy up. She therefore underwent colonoscopy on 12/18/18which showed a fungating and infiltrative partially obstructing large mass in the mid ascending colon measuring about 4 cm. Pathology showed invasive adenocarcinoma. Moderately differentiated  Patient underwent right hemicolectomy on 06/02/2017 which showed invasive adenocarcinoma 3.1 cm, grade 2. Tumor invades through the muscularis propria into pericolonic colorectal tissue. Proximal and distal margins were negative but radial margin was positive. LV I was present. PNI not identified. 2 tumor deposits were present. 1 out of 36 lymph nodes was positive for metastatic disease. Pathological stage pT3pN1a. MSI stable  risks and benefits of 3 months of CAPEOX versus 6 months of FOLFOX discussed and patient opted to go for Capeox    Interval history- completed last dose of xeloda on 07/20/17. She has been doing poorly. Barely able to eat and drink because of severe nausea and vomiting. She has frequent diarrhea that is not profuse but says it comes on whenever she eats anything little. Feels fatigued. Nausea meds have not been helping and she has not been able to keep up with her fluid intake. Denies any abdominal pain  ECOG PS- 11 Pain scale- 0   Review of systems- Review of Systems  Constitutional: Positive  for malaise/fatigue. Negative for chills, fever and weight loss.  HENT: Negative for congestion, ear discharge and nosebleeds.   Eyes: Negative for blurred vision.  Respiratory: Negative for cough, hemoptysis, sputum production, shortness of breath and  wheezing.   Cardiovascular: Negative for chest pain, palpitations, orthopnea and claudication.  Gastrointestinal: Positive for diarrhea, nausea and vomiting. Negative for abdominal pain, blood in stool, constipation, heartburn and melena.  Genitourinary: Negative for dysuria, flank pain, frequency, hematuria and urgency.  Musculoskeletal: Negative for back pain, joint pain and myalgias.  Skin: Negative for rash.  Neurological: Negative for dizziness, tingling, focal weakness, seizures, weakness and headaches.  Endo/Heme/Allergies: Does not bruise/bleed easily.  Psychiatric/Behavioral: Negative for depression and suicidal ideas. The patient does not have insomnia.       Allergies  Allergen Reactions  . Actonel [Risedronate Sodium] Nausea And Vomiting    Dizziness. Given 10 years ago  . Calcitonin ([place]) Nausea And Vomiting    dizziness  . Ciprofloxacin Nausea Only    Very sick within 2 days of taking antibiotic     Past Medical History:  Diagnosis Date  . Cancer ([place]) 05/09/2017   INVASIVE ADENOCARCINOMA, MODERATELY DIFFERENTIATED, ascending colon  . Dysrhythmia    tachycardia occasionally  . H/O herpes zoster   . History of Helicobacter pylori infection   . Hyperlipidemia   . Hypertension   . Iron deficiency anemia    was on feraheme but stopped due to side effects  . Leukopenia   . Osteoarthritis involving multiple joints on both sides of body   . Osteopenia   . Subacute thyroiditis   . Tachycardia      Past Surgical History:  Procedure Laterality Date  . CHOLECYSTECTOMY  2003   Dr Nicholes Stairs  . COLON RESECTION Right 06/02/2017   Laparoscopically assisted right colectomy.  pT3 pN1a, No loss of nuclear expression of MMR proteins.  Surgeon: Robert Bellow, MD;  Location: [facility] ORS;  Service: General;  Laterality: Right;  . COLONOSCOPY WITH PROPOFOL N/A 05/09/2017   Procedure: COLONOSCOPY WITH PROPOFOL;  Surgeon: Lollie Sails, MD;  Location: [facility] ENDOSCOPY;   Service: Endoscopy;  Laterality: N/A;  . ESOPHAGOGASTRODUODENOSCOPY (EGD) WITH PROPOFOL N/A 03/21/2017   Procedure: ESOPHAGOGASTRODUODENOSCOPY (EGD) WITH PROPOFOL;  Surgeon: Lollie Sails, MD;  Location: [facility] ENDOSCOPY;  Service: Endoscopy;  Laterality: N/A;  . EYE SURGERY Left    cataract surgery  . hyperlipedemia N/A   . irregular HR      Social History   Socioeconomic History  . Marital status: Married    Spouse name: Not on file  . Number of children: Not on file  . Years of education: Not on file  . Highest education level: Not on file  Social Needs  . Financial resource strain: Not on file  . Food insecurity - worry: Not on file  . Food insecurity - inability: Not on file  . Transportation needs - medical: Not on file  . Transportation needs - non-medical: Not on file  Occupational History  . Not on file  Tobacco Use  . Smoking status: Former Research scientist (life sciences)  . Smokeless tobacco: Never Used  . Tobacco comment: smoked in college  Substance and Sexual Activity  . Alcohol use: Yes    Comment: OCCASIONAL  . Drug use: No  . Sexual activity: No  Other Topics Concern  . Not on file  Social History Narrative  . Not on file    Family History  Problem Relation Age of Onset  .  Heart disease Father   . Hypertension Father   . Heart attack Father   . Heart attack Paternal Uncle   . Heart attack Paternal Uncle   . Heart attack Paternal Uncle      Current Outpatient Medications:  .  acetaminophen (TYLENOL) 500 MG tablet, Take 500 mg by mouth every 6 (six) hours as needed., Disp: , Rfl:  .  atorvastatin (LIPITOR) 10 MG tablet, Take 10 mg by mouth daily. In the morning, Disp: , Rfl:  .  atorvastatin (LIPITOR) 10 MG tablet, TAKE 1 TABLET PO ONCE DAILY, Disp: , Rfl: 2 .  azelastine (ASTELIN) 0.1 % nasal spray, instill 2 sprays into each nostril twice a day for 7 days then twice a day if needed congestion, Disp: , Rfl:  .  Calcium Carbonate-Vitamin D (CALTRATE 600+D PO), Take 1  tablet by mouth daily. , Disp: , Rfl:  .  capecitabine (XELODA) 500 MG tablet, Take 1,500 mg by mouth 2 (two) times daily after a meal. Take 3 tablets (1,500 mg total) by mouth 2 (two) times daily after a meal for 14 days. And then take 1 week off, Disp: , Rfl:  .  Cholecalciferol 10000 units TABS, Take 1,000 Units by mouth daily. In the morning, Disp: , Rfl:  .  diphenoxylate-atropine (LOMOTIL) 2.5-0.025 MG tablet, Take 1 tablet by mouth 4 (four) times daily as needed for diarrhea or loose stools., Disp: 60 tablet, Rfl: 0 .  LORazepam (ATIVAN) 0.5 MG tablet, Take 1 tablet (0.5 mg total) by mouth every 6 (six) hours as needed (Nausea or vomiting)., Disp: 30 tablet, Rfl: 0 .  ondansetron (ZOFRAN) 8 MG tablet, Take 1 tablet (8 mg total) by mouth 2 (two) times daily as needed for refractory nausea / vomiting. Start on day 3 after chemotherapy., Disp: 30 tablet, Rfl: 1 .  prochlorperazine (COMPAZINE) 10 MG tablet, Take 1 tablet (10 mg total) by mouth every 6 (six) hours as needed (Nausea or vomiting)., Disp: 30 tablet, Rfl: 1 .  tretinoin (RETIN-A) 0.1 % cream, Apply 1 application topically at bedtime as needed (SKIN DISCOLORATION). Uses every other night or so on face due to spot on nose that darkens, Disp: , Rfl:  .  vitamin B-12 (CYANOCOBALAMIN) 500 MCG tablet, Take 500 mcg by mouth daily. 1 gummie in the morning, Disp: , Rfl:  .  amLODipine (NORVASC) 5 MG tablet, Take 1 tablet (5 mg total) by mouth daily. (Patient not taking: Reported on 08/16/2017), Disp: 30 tablet, Rfl: 0 .  dexamethasone (DECADRON) 4 MG tablet, Take 2 tablets (8 mg total) by mouth daily. Start the day after chemotherapy for 2 days. Take with food. (Patient not taking: Reported on 08/08/2017), Disp: 30 tablet, Rfl: 1 .  lidocaine-prilocaine (EMLA) cream, Apply to affected area once (Patient not taking: Reported on 08/19/2017), Disp: 30 g, Rfl: 3 .  pantoprazole (PROTONIX) 40 MG tablet, Take 1 tablet (40 mg total) by mouth 2 (two) times  daily. (Patient not taking: Reported on 08/03/2017), Disp: 28 tablet, Rfl: 0 .  urea (CARMOL) 10 % cream, Apply topically 3 (three) times daily as needed. To skin areas (Patient not taking: Reported on 07/24/2017), Disp: 71 g, Rfl: 0  Physical exam:  Vitals:   07/29/2017 1420  BP: 128/87  Pulse: (!) 123  Resp: 18  Temp: (!) 97.1 F (36.2 C)  TempSrc: Tympanic   Physical Exam  Constitutional: She is oriented to person, place, and time.  Thin elderly female who appears pale and fatigued.  Sitting in a wheelchair  HENT:  Head: Normocephalic and atraumatic.  Mucous membranes appear dry  Eyes: EOM are normal. Pupils are equal, round, and reactive to light.  Neck: Normal range of motion.  Cardiovascular: Regular rhythm and normal heart sounds.  tachycardic  Pulmonary/Chest: Effort normal and breath sounds normal.  Abdominal: Soft. Bowel sounds are normal.  Neurological: She is alert and oriented to person, place, and time.  Skin: Skin is warm and dry.     CMP Latest Ref Rng & Units 07/21/2017  Glucose 65 - 99 mg/dL 133(H)  BUN 6 - 20 mg/dL 13  Creatinine 0.44 - 1.00 mg/dL 0.67  Sodium 135 - 145 mmol/L 133(L)  Potassium 3.5 - 5.1 mmol/L 3.6  Chloride 101 - 111 mmol/L 103  CO2 22 - 32 mmol/L 23  Calcium 8.9 - 10.3 mg/dL 9.2  Total Protein 6.5 - 8.1 g/dL -  Total Bilirubin 0.3 - 1.2 mg/dL -  Alkaline Phos 38 - 126 U/L -  AST 15 - 41 U/L -  ALT 14 - 54 U/L -   CBC Latest Ref Rng & Units 07/19/2017  WBC 3.6 - 11.0 K/uL 10.5  Hemoglobin 12.0 - 16.0 g/dL 12.4  Hematocrit 35.0 - 47.0 % 37.1  Platelets 150 - 440 K/uL 277     Assessment and plan- Patient is a 82 y.o. female withadenocarcinoma of the ascending colon at least stage IIIB pT3 pN1a cM0status post right hemicolectomys/p cycle 1 of [place] [facility]. Last dose of xeloda was on 07/20/17  Nausea/ vomiting and diarrhea- likely due to xeloda. Stool GI panel last week was negative. C diff antigen was positive but negative for toxin and PCR.  Therefore not treated for C diff. Given her severe dehydration and poor oral intake as well as severe hypokalemia, hyponatremia and AKI (creatinien elevated to 1.03 from baseline of 0.6); I would recommend direct admission to the hospital at this time. We will give her IV fluids along with IV decadron and IV zofran while she is awaiting bed. Start IV fluids in the meantime. Her potassium today is 2.9 and she will need IV potassium on admission. Magnesium 2.3 today  It may be worthwhile to repeat C diff. Patient had a UA checked last week when she was having nausea/ vomiting but no other urinary symptoms. Repeat UA was negative urine cx shows citrobacter fundii. This is unlikely to be a cause of her nausea/ vomiting and I would recommend getting ID recommendation if this needs to be treated with antibiotics especially given C diff antigen was positive (possible carrier)  Would recommend adding zyprexa for her nausea in addition to prn IV zofran to control her nausea while inpatient in addition to fluids and electrolyte replacements  Further chemotherapy on hold until acute symptoms improve   Visit Diagnosis 1. Cancer of ascending colon ([place])   2. Chemotherapy induced nausea and vomiting   3. Chemotherapy induced diarrhea   4. Dehydration   5. Hypokalemia      Dr. Randa Evens, MD, MPH [facility] Pager- [phone] 08/06/2017 2:52 PM

## 2017-07-25 NOTE — H&P (Addendum)
[place] [facility] [place] NAME: Sara Smith    MR#:  [id number]  DATE OF BIRTH:  [birth year]  DATE OF ADMISSION:  08/17/2017  PRIMARY CARE PHYSICIAN: Adin Hector, MD   REQUESTING/REFERRING PHYSICIAN:   CHIEF COMPLAINT:  Nausea and vomiting  HISTORY OF PRESENT ILLNESS: Sara Smith  is a 82 y.o. female with a known history of hyperlipidemia, hypertension, colonic carcinoma, iron deficiency anemia was referred from oncology office for nausea and vomiting.  Patient has intractable nausea and vomiting unable to keep any food down the stomach.  She also has some diarrhea for the last couple of days to a week.  She was recently tested last month for C. difficile toxin which was negative but C. difficile antigen was positive.  Patient feels weak and tired secondary to poor oral intake.  She appears dry and dehydrated.  No complaints of any chest pain, shortness of breath.  No abdominal pain.  No rectal bleed.  PAST MEDICAL HISTORY:   Past Medical History:  Diagnosis Date  . Cancer ([place]) 05/09/2017   INVASIVE ADENOCARCINOMA, MODERATELY DIFFERENTIATED, ascending colon  . Dysrhythmia    tachycardia occasionally  . H/O herpes zoster   . History of Helicobacter pylori infection   . Hyperlipidemia   . Hypertension   . Iron deficiency anemia    was on feraheme but stopped due to side effects  . Leukopenia   . Osteoarthritis involving multiple joints on both sides of body   . Osteopenia   . Subacute thyroiditis   . Tachycardia     PAST SURGICAL HISTORY:  Past Surgical History:  Procedure Laterality Date  . CHOLECYSTECTOMY  2003   Dr Nicholes Stairs  . COLON RESECTION Right 06/02/2017   Laparoscopically assisted right colectomy.  pT3 pN1a, No loss of nuclear expression of MMR proteins.  Surgeon: Robert Bellow, MD;  Location: [facility] ORS;  Service: General;  Laterality: Right;  . COLONOSCOPY WITH PROPOFOL N/A 05/09/2017   Procedure:  COLONOSCOPY WITH PROPOFOL;  Surgeon: Lollie Sails, MD;  Location: [facility] ENDOSCOPY;  Service: Endoscopy;  Laterality: N/A;  . ESOPHAGOGASTRODUODENOSCOPY (EGD) WITH PROPOFOL N/A 03/21/2017   Procedure: ESOPHAGOGASTRODUODENOSCOPY (EGD) WITH PROPOFOL;  Surgeon: Lollie Sails, MD;  Location: [facility] ENDOSCOPY;  Service: Endoscopy;  Laterality: N/A;  . EYE SURGERY Left    cataract surgery  . hyperlipedemia N/A   . irregular HR      SOCIAL HISTORY:  Social History   Tobacco Use  . Smoking status: Former Research scientist (life sciences)  . Smokeless tobacco: Never Used  . Tobacco comment: smoked in college  Substance Use Topics  . Alcohol use: Yes    Comment: OCCASIONAL    FAMILY HISTORY:  Family History  Problem Relation Age of Onset  . Heart disease Father   . Hypertension Father   . Heart attack Father   . Heart attack Paternal Uncle   . Heart attack Paternal Uncle   . Heart attack Paternal Uncle     DRUG ALLERGIES:  Allergies  Allergen Reactions  . Actonel [Risedronate Sodium] Nausea And Vomiting    Dizziness. Given 10 years ago  . Calcitonin ([place]) Nausea And Vomiting    dizziness  . Ciprofloxacin Nausea Only    Very sick within 2 days of taking antibiotic    REVIEW OF SYSTEMS:   CONSTITUTIONAL: No fever, has weakness.  EYES: No blurred or double vision.  EARS, NOSE, AND THROAT: No tinnitus or ear pain.  RESPIRATORY: No cough, shortness of breath, wheezing or hemoptysis.  CARDIOVASCULAR: No chest pain, orthopnea, edema.  GASTROINTESTINAL: Has nausea, vomiting, diarrhea  no abdominal pain.  GENITOURINARY: No dysuria, hematuria.  ENDOCRINE: No polyuria, nocturia,  HEMATOLOGY: No anemia, easy bruising or bleeding SKIN: No rash or lesion. MUSCULOSKELETAL: No joint pain or arthritis.   NEUROLOGIC: No tingling, numbness, weakness.  PSYCHIATRY: No anxiety or depression.   MEDICATIONS AT HOME:  Prior to Admission medications   Medication Sig Start Date End Date Taking? Authorizing  Provider  acetaminophen (TYLENOL) 500 MG tablet Take 500 mg by mouth every 6 (six) hours as needed.    [provider]  amLODipine (NORVASC) 5 MG tablet Take 1 tablet (5 mg total) by mouth daily. Patient not taking: Reported on 07/29/2017 06/05/17   Robert Bellow, MD  atorvastatin (LIPITOR) 10 MG tablet Take 10 mg by mouth daily. In the morning 12/07/15 08/02/2017  [provider]  atorvastatin (LIPITOR) 10 MG tablet TAKE 1 TABLET PO ONCE DAILY 07/16/17   [provider]  azelastine (ASTELIN) 0.1 % nasal spray instill 2 sprays into each nostril twice a day for 7 days then twice a day if needed congestion 05/24/16   [provider]  Calcium Carbonate-Vitamin D (CALTRATE 600+D PO) Take 1 tablet by mouth daily.     [provider]  capecitabine (XELODA) 500 MG tablet Take 1,500 mg by mouth 2 (two) times daily after a meal. Take 3 tablets (1,500 mg total) by mouth 2 (two) times daily after a meal for 14 days. And then take 1 week off    Sindy Guadeloupe, MD  Cholecalciferol 10000 units TABS Take 1,000 Units by mouth daily. In the morning    [provider]  dexamethasone (DECADRON) 4 MG tablet Take 2 tablets (8 mg total) by mouth daily. Start the day after chemotherapy for 2 days. Take with food. Patient not taking: Reported on 08/17/2017 07/03/17   Sindy Guadeloupe, MD  diphenoxylate-atropine (LOMOTIL) 2.5-0.025 MG tablet Take 1 tablet by mouth 4 (four) times daily as needed for diarrhea or loose stools. 07/07/17 08/06/17  Sindy Guadeloupe, MD  lidocaine-prilocaine (EMLA) cream Apply to affected area once Patient not taking: Reported on 08/05/2017 07/03/17   Sindy Guadeloupe, MD  LORazepam (ATIVAN) 0.5 MG tablet Take 1 tablet (0.5 mg total) by mouth every 6 (six) hours as needed (Nausea or vomiting). 07/03/17   Sindy Guadeloupe, MD  ondansetron (ZOFRAN) 8 MG tablet Take 1 tablet (8 mg total) by mouth 2 (two) times daily as needed for refractory nausea / vomiting. Start on  day 3 after chemotherapy. 07/03/17   Sindy Guadeloupe, MD  pantoprazole (PROTONIX) 40 MG tablet Take 1 tablet (40 mg total) by mouth 2 (two) times daily. Patient not taking: Reported on 08/10/2017 09/30/16   Sindy Guadeloupe, MD  prochlorperazine (COMPAZINE) 10 MG tablet Take 1 tablet (10 mg total) by mouth every 6 (six) hours as needed (Nausea or vomiting). 07/03/17   Sindy Guadeloupe, MD  tretinoin (RETIN-A) 0.1 % cream Apply 1 application topically at bedtime as needed (SKIN DISCOLORATION). Uses every other night or so on face due to spot on nose that darkens 12/07/15   [provider]  urea (CARMOL) 10 % cream Apply topically 3 (three) times daily as needed. To skin areas Patient not taking: Reported on 08/05/2017 07/07/17   Sindy Guadeloupe, MD  vitamin B-12 (CYANOCOBALAMIN) 500 MCG tablet Take 500 mcg by mouth  daily. 1 gummie in the morning    [provider]      PHYSICAL EXAMINATION:   VITAL SIGNS: There were no vitals taken for this visit.  GENERAL:  82 y.o.-year-old patient lying in the bed with no acute distress.  EYES: Pupils equal, round, reactive to light and accommodation. No scleral icterus. Extraocular muscles intact.  HEENT: Head atraumatic, normocephalic. Oropharynx dry and nasopharynx clear.  NECK:  Supple, no jugular venous distention. No thyroid enlargement, no tenderness.  LUNGS: Normal breath sounds bilaterally, no wheezing, rales,rhonchi or crepitation. No use of accessory muscles of respiration.  CARDIOVASCULAR: S1, S2 normal. No murmurs, rubs, or gallops.  ABDOMEN: Soft, nontender, nondistended. Bowel sounds present. No organomegaly or mass.  EXTREMITIES: No pedal edema, cyanosis, or clubbing.  NEUROLOGIC: Cranial nerves II through XII are intact. Muscle strength 5/5 in all extremities. Sensation intact. Gait not checked.  PSYCHIATRIC: The patient is alert and oriented x 3.  SKIN: No obvious rash, lesion, or ulcer.   LABORATORY PANEL:   CBC Recent Labs  Lab  07/19/17 0958 08/11/2017 1315  WBC 10.5 5.6  HGB 12.4 14.7  HCT 37.1 44.6  PLT 277 430  MCV 84.0 84.2  MCH 28.1 27.7  MCHC 33.4 32.9  RDW 27.8* 27.8*  LYMPHSABS 0.9* 0.8*  MONOABS 0.4 1.3*  EOSABS 0.0 0.0  BASOSABS 0.0 0.0   ------------------------------------------------------------------------------------------------------------------  Chemistries  Recent Labs  Lab 07/19/17 0958 07/21/17 1318 08/12/2017 1315  NA 131* 133* 132*  K 4.0 3.6 2.9*  CL 100* 103 103  CO2 23 23 18*  GLUCOSE 135* 133* 166*  BUN 12 13 26*  CREATININE 0.70 0.67 1.03*  CALCIUM 9.3 9.2 8.8*  MG 1.9  --  2.3  AST 18  --  19  ALT 17  --  11*  ALKPHOS 68  --  59  BILITOT 0.6  --  0.6   ------------------------------------------------------------------------------------------------------------------ estimated creatinine clearance is 35 mL/min (A) (by C-G formula based on SCr of 1.03 mg/dL (H)). ------------------------------------------------------------------------------------------------------------------ No results for input(s): TSH, T4TOTAL, T3FREE, THYROIDAB in the last 72 hours.  Invalid input(s): FREET3   Coagulation profile No results for input(s): INR, PROTIME in the last 168 hours. ------------------------------------------------------------------------------------------------------------------- No results for input(s): DDIMER in the last 72 hours. -------------------------------------------------------------------------------------------------------------------  Cardiac Enzymes No results for input(s): CKMB, TROPONINI, MYOGLOBIN in the last 168 hours.  Invalid input(s): CK ------------------------------------------------------------------------------------------------------------------ Invalid input(s): POCBNP  ---------------------------------------------------------------------------------------------------------------  Urinalysis    Component Value Date/Time   COLORURINE  YELLOW (A) 07/24/2017 1015   APPEARANCEUR HAZY (A) 07/24/2017 1015   LABSPEC 1.030 07/24/2017 1015   PHURINE 6.0 07/24/2017 1015   GLUCOSEU NEGATIVE 07/24/2017 1015   HGBUR NEGATIVE 07/24/2017 1015   BILIRUBINUR NEGATIVE 07/24/2017 1015   KETONESUR 20 (A) 07/24/2017 1015   PROTEINUR NEGATIVE 07/24/2017 1015   NITRITE NEGATIVE 07/24/2017 1015   LEUKOCYTESUR NEGATIVE 07/24/2017 1015     RADIOLOGY: No results found.  EKG: Orders placed or performed in visit on 05/29/17  . EKG 12-Lead    IMPRESSION AND PLAN: 82 year old female patient with history of colonic carcinoma, iron deficiency anemia, hypertension, hyperlipidemia presented to the emergency room with nausea vomiting and diarrhea.  Admitting diagnosis 1.  Intractable nausea vomiting secondary to chemotherapy 2.  Hypokalemia 3.  Hyponatremia 4.  Dehydration 5.  Chemotherapy-induced diarrhea Treatment plan Admit patient to medical floor IV fluid hydration Intravenous potassium supplementation Check stool ova cyst and parasite Monitor electrolytes Advance diet as tolerated Infectious disease consultation Clostridium Difficille testing as  patient has persistent diarrhea with cdiff antigen positivity last month  All the records are reviewed and case discussed with ED provider. Management plans discussed with the patient, family and they are in agreement.  CODE STATUS:FULL CODE    Code Status Orders  (From admission, onward)        Start     Ordered   08/07/2017 1711  Full code  Continuous     08/07/2017 1710    Code Status History    Date Active Date Inactive Code Status Order ID Comments User Context   06/02/2017 14:30 06/05/2017 15:07 Full Code [id number]  Robert Bellow, MD Inpatient       TOTAL TIME TAKING CARE OF THIS PATIENT: 50 minutes.    Saundra Shelling M.D on 07/30/2017 at 5:47 PM  Between 7am to 6pm - Pager - [phone]  After 6pm go to [email or web address] - password [email or web address]  [place]  [facility]  Office  [phone]  CC: Primary care physician; Adin Hector, MD

## 2017-07-25 NOTE — Progress Notes (Signed)
Thank you.  She will need a repeat colonoscopy in one year.  My office will notify.

## 2017-07-26 DIAGNOSIS — Z87891 Personal history of nicotine dependence: Secondary | ICD-10-CM

## 2017-07-26 DIAGNOSIS — E860 Dehydration: Secondary | ICD-10-CM

## 2017-07-26 DIAGNOSIS — R530 Neoplastic (malignant) related fatigue: Secondary | ICD-10-CM

## 2017-07-26 DIAGNOSIS — K5903 Drug induced constipation: Secondary | ICD-10-CM

## 2017-07-26 DIAGNOSIS — E876 Hypokalemia: Principal | ICD-10-CM

## 2017-07-26 DIAGNOSIS — C182 Malignant neoplasm of ascending colon: Secondary | ICD-10-CM

## 2017-07-26 DIAGNOSIS — E43 Unspecified severe protein-calorie malnutrition: Secondary | ICD-10-CM

## 2017-07-26 LAB — BASIC METABOLIC PANEL
Anion gap: 8 (ref 5–15)
BUN: 19 mg/dL (ref 6–20)
CHLORIDE: 108 mmol/L (ref 101–111)
CO2: 20 mmol/L — ABNORMAL LOW (ref 22–32)
CREATININE: 0.83 mg/dL (ref 0.44–1.00)
Calcium: 8.3 mg/dL — ABNORMAL LOW (ref 8.9–10.3)
GFR calc non Af Amer: >60 mL/min (ref >60)
Glucose, Bld: 139 mg/dL — ABNORMAL HIGH (ref 65–99)
POTASSIUM: 3.6 mmol/L (ref 3.5–5.1)
Sodium: 136 mmol/L (ref 135–145)

## 2017-07-26 LAB — URINE CULTURE: Culture: 50000 — AB

## 2017-07-26 LAB — CBC
HEMATOCRIT: 38.9 % (ref 35.0–47.0)
Hemoglobin: 12.8 g/dL (ref 12.0–16.0)
MCH: 27.8 pg (ref 26.0–34.0)
MCHC: 33.1 g/dL (ref 32.0–36.0)
MCV: 84.2 fL (ref 80.0–100.0)
PLATELETS: 342 10*3/uL (ref 150–440)
RBC: 4.61 MIL/uL (ref 3.80–5.20)
RDW: 27.7 % — ABNORMAL HIGH (ref 11.5–14.5)
WBC: 3.9 10*3/uL (ref 3.6–11.0)

## 2017-07-26 MED ORDER — UNJURY CHICKEN SOUP POWDER
2.0000 [oz_av] | Freq: Three times a day (TID) | ORAL | Status: DC
  Administered 2017-07-31: 23:00:00 2 [oz_av] via ORAL

## 2017-07-26 MED ORDER — LOPERAMIDE HCL 2 MG PO CAPS
2.0000 mg | ORAL_CAPSULE | Freq: Four times a day (QID) | ORAL | Status: DC | PRN
  Administered 2017-07-28 – 2017-07-29 (×2): 2 mg via ORAL
  Filled 2017-07-26 (×4): qty 1

## 2017-07-26 MED ORDER — ADULT MULTIVITAMIN W/MINERALS CH
1.0000 | ORAL_TABLET | Freq: Every day | ORAL | Status: DC
  Filled 2017-07-26 (×2): qty 1

## 2017-07-26 NOTE — Progress Notes (Signed)
[place] [facility] [place] NAME: Sara Smith    MR#:  [id number]  DATE OF BIRTH:  [birth year]  SUBJECTIVE:  CHIEF COMPLAINT:  No chief complaint on file. feeling nauseous, vomited once while I was in the room. Husband at bedside. She is wanting to be comfortable and avoid suffering - says she can't tolerate chemo REVIEW OF SYSTEMS:  Review of Systems  Constitutional: Negative for chills, fever and weight loss.  HENT: Negative for nosebleeds and sore throat.   Eyes: Negative for blurred vision.  Respiratory: Negative for cough, shortness of breath and wheezing.   Cardiovascular: Negative for chest pain, orthopnea, leg swelling and PND.  Gastrointestinal: Positive for nausea and vomiting. Negative for abdominal pain, constipation, diarrhea and heartburn.  Genitourinary: Negative for dysuria and urgency.  Musculoskeletal: Negative for back pain.  Skin: Negative for rash.  Neurological: Negative for dizziness, speech change, focal weakness and headaches.  Endo/Heme/Allergies: Does not bruise/bleed easily.  Psychiatric/Behavioral: Negative for depression.   DRUG ALLERGIES:   Allergies  Allergen Reactions  . Actonel [Risedronate Sodium] Nausea And Vomiting    Dizziness. Given 10 years ago  . Calcitonin ([place]) Nausea And Vomiting    dizziness  . Ciprofloxacin Nausea Only    Very sick within 2 days of taking antibiotic   VITALS:  Blood pressure (!) 148/66, pulse 95, temperature (!) 97.4 F (36.3 C), temperature source Oral, resp. rate 16, height 5\' 1"  (1.549 m), weight 56.2 kg (124 lb), SpO2 100 %. PHYSICAL EXAMINATION:  Physical Exam  Constitutional: She is oriented to person, place, and time and well-developed, well-nourished, and in no distress.  HENT:  Head: Normocephalic and atraumatic.  Eyes: Conjunctivae and EOM are normal. Pupils are equal, round, and reactive to light.  Neck: Normal range of motion. Neck supple. No tracheal  deviation present. No thyromegaly present.  Cardiovascular: Normal rate, regular rhythm and normal heart sounds.  Pulmonary/Chest: Effort normal and breath sounds normal. No respiratory distress. She has no wheezes. She exhibits no tenderness.  Abdominal: Soft. Bowel sounds are normal. She exhibits no distension. There is no tenderness.  Musculoskeletal: Normal range of motion.  Neurological: She is alert and oriented to person, place, and time. No cranial nerve deficit.  Skin: Skin is warm and dry. No rash noted.  Psychiatric: Mood and affect normal.   LABORATORY PANEL:  Female CBC Recent Labs  Lab 07/26/17 0538  WBC 3.9  HGB 12.8  HCT 38.9  PLT 342   ------------------------------------------------------------------------------------------------------------------ Chemistries  Recent Labs  Lab 08/19/2017 1315 07/26/17 0538  NA 132* 136  K 2.9* 3.6  CL 103 108  CO2 18* 20*  GLUCOSE 166* 139*  BUN 26* 19  CREATININE 1.03* 0.83  CALCIUM 8.8* 8.3*  MG 2.3  --   AST 19  --   ALT 11*  --   ALKPHOS 59  --   BILITOT 0.6  --    RADIOLOGY:  No results found. ASSESSMENT AND PLAN:  82 year old female patient with history of colon carcinoma, iron deficiency anemia, hypertension, hyperlipidemia admitted with nausea vomiting and diarrhea.  1.  Intractable nausea vomiting secondary to chemotherapy 2.  Hypokalemia: repleted and resolved 3.  Hyponatremia: resolved with hydration 4.  Dehydration: due to N/V, improving with hydration. 5.  Chemotherapy-induced diarrhea: GI panel and c. Diff neg.     All the records are reviewed and case discussed with Care Management/Social Worker. Management plans discussed with the patient, family (husband) and they  are in agreement.  CODE STATUS: Full Code  TOTAL TIME TAKING CARE OF THIS PATIENT: 35 minutes.   More than 50% of the time was spent in counseling/coordination of care: YES  POSSIBLE D/C IN 1-2 DAYS, DEPENDING ON CLINICAL  CONDITION.   Max Sane M.D on 07/26/2017 at 10:49 AM  Between 7am to 6pm - Pager - [phone]  After 6pm go to [email or web address] - password [email or web address] [facility]  [facility] [place]  Office  [phone]  CC: Primary care physician; Adin Hector, MD  Note: This dictation was prepared with Dragon dictation along with smaller phrase technology. Any transcriptional errors that result from this process are unintentional.

## 2017-07-26 NOTE — Progress Notes (Signed)
Initial Nutrition Assessment  DOCUMENTATION CODES:   Severe malnutrition in context of acute illness/injury  INTERVENTION:   If patient is unable to tolerate oral nutrition in the next 1-2 days; recommend post-pyloric NGT placement and enteral feeds.   Pt at high refeeding risk; recommend monitor K, Mg, and P labs once oral intake improved  Unjury chicken Soup TID, Each serving provides 100kcal and 21g protein   MVI daily  NUTRITION DIAGNOSIS:   Severe Malnutrition related to acute illness(cancer and related treatments) as evidenced by percent weight loss, moderate fat depletion, moderate muscle depletion, energy intake < or equal to 50% for > or equal to 5 days.  GOAL:   Patient will meet greater than or equal to 90% of their needs  MONITOR:   PO intake, Supplement acceptance, Diet advancement, Labs, Weight trends, I & O's  REASON FOR ASSESSMENT:   Malnutrition Screening Tool    ASSESSMENT:    82 y.o. female with adenocarcinoma of the ascending colon at least stage IIIB status post right hemicolectomy. Last dose of xeloda was on 07/20/17. Nausea/ vomiting and diarrhea- likely due to xeloda.   Met with pt in room today. Pt with new colon cancer diagnosis s/p hemicolectomy 1/11. Pt reports that she has been having a slow decline in her weight since her diagnosis. Per chart, pt has lost 14lbs(10%) in two months; this is significant given the time frame. Pt reports nausea and vomiting for the past two weeks. Pt has been unable to keep any food down for 5-7 days. Pt vomiting at time of RD visit today. Pt is willing to try supplements but is in unlikely she will be able to keep them down today; RD will initiate supplements for 3/7. Pt does not like sweet supplements but would be willing to try vanilla or strawberry Ensure when she is feeling better. Pt has not been drinking any supplements at home. If pt is unable to tolerate oral intake in the next 1-2 days; recommend post pyloric NGT  placement and enteral feeds. Pt is at high refeeding risk; recommend monitor K, Mg, and P once oral intake improves.     Medications reviewed and include: oscal with D, Vit D, lovenox, NaCl @100ml /hr  Labs reviewed: 3.6 wnl, Ca 8.3(L) Mg 2.3 wnl Iron 13(L), TIBC 506(H), ferritin 7(L)- 1/22 cbgs- 166, 139 x 24 hrs  Nutrition-Focused physical exam completed. Findings are moderate fat depletions in arms and chest, moderate muscle depletions in clavicles and shoulders and no edema.   Diet Order:  Diet clear liquid Room service appropriate? Yes; Fluid consistency: Thin  EDUCATION NEEDS:   Education needs have been addressed  Skin: Reviewed RN Assessment  Last BM:  3/5- type 7  Height:   Ht Readings from Last 1 Encounters:  08/11/2017 5' 1"  (1.549 m)    Weight:   Wt Readings from Last 1 Encounters:  07/26/17 124 lb (56.2 kg)    Ideal Body Weight:  47.7 kg  BMI:  Body mass index is 23.43 kg/m.  Estimated Nutritional Needs:   Kcal:  1350-1550kcal/day   Protein:  73-85g/day   Fluid:  >1.4L/day   Koleen Distance MS, RD, LDN Pager #[phone] After Hours Pager: [phone]

## 2017-07-26 NOTE — Progress Notes (Signed)
Family Meeting Note  Advance Directive:yes  Today a meeting took place with the Patient and spouse.  The following clinical team members were present during this meeting:MD  The following were discussed:Patient's diagnosis:   Patient is a 82 y.o. female withadenocarcinoma of the ascending colon at least stage IIIB status post right hemicolectomy. Last dose of xeloda was on 07/20/17. Nausea/ vomiting and diarrhea- likely due to xeloda.  Patient's progosis: > 12 months and Goals for treatment: DNR  Additional follow-up to be provided: Palliative care c/s, Patient would like to stay comfortable and avoid side-effects of chemo (unless there is alternative chemo). I've asked her to d/w Oncologist to make this discussion and then make informed decision.  Time spent during discussion:20 minutes  Sara Sane, MD

## 2017-07-26 NOTE — Consult Note (Signed)
   [facility] CM Inpatient Consult   07/26/2017  Sara Smith 1936-02-26 [id number]  Referral from inpatient MD for [place] Management services and post hospital discharge follow up related to a diagnosis of Cancer and has had 2 admits in 6 months. Patient was evaluated for community based chronic disease management services with [facility] care Management Program as a benefit of patient's [employer] Medicare. Met with the patient and her husband Sara Smith at the bedside to explain [place] Management services. Patient gave permission to speak with her husband because she was feeling nauseated. Verbal consent obtained by patient and spouse. Patient's spouse gave [phone] or [phone] as the best number to reach them.  Patient gave verbal permission for the [place] to speak with her spouse. Patient will receive post hospital discharge calls and be evaluated for monthly home visits. [facility] Care Management services does not interfere with or replace any services arranged by the inpatient care management team. RNCM left contact information and [facility] literature at the bedside. Made inpatient RNCM aware  [facility] will be following for care management. For additional questions please contact:   Chesky Heyer RN, [facility] Liaison  [phone]) Business Mobile [phone]) Toll free office

## 2017-07-26 NOTE — Consult Note (Signed)
[place]  Telephone:([phone]) [phone] Fax:(336) [phone]  ID: Rinaldo Ratel OB: 1935-10-14  MR#: [id number]  JIR#:[id number]  Patient Care Team: Adin Hector, MD as PCP - General (Internal Medicine) Bary Castilla Forest Gleason, MD (General Surgery)  CHIEF COMPLAINT: Intractable nausea and vomiting, colon cancer.  INTERVAL HISTORY: Patient is a 82 year old female who is undergoing chemotherapy for newly diagnosed colon cancer who presented to the clinic yesterday with intractable nausea and vomiting.  Patient states she is tolerating PO better, but still not back to her baseline.  She continues to have mild nausea.  She has no neurologic complaints.  She has increased weakness and fatigue.  She denies any chest pain or shortness of breath.  She does not complain of any further diarrhea.  She has no melena or hematochezia.  Patient feels generally terrible, but offers no further specific complaints.  REVIEW OF SYSTEMS:   Review of Systems  Constitutional: Positive for malaise/fatigue. Negative for fever and weight loss.  Respiratory: Negative.  Negative for cough and shortness of breath.   Cardiovascular: Negative.  Negative for chest pain and leg swelling.  Gastrointestinal: Positive for nausea. Negative for abdominal pain, blood in stool, diarrhea and melena.  Genitourinary: Negative.  Negative for dysuria.  Musculoskeletal: Negative.   Skin: Negative.  Negative for rash.  Neurological: Positive for weakness.  Psychiatric/Behavioral: The patient is nervous/anxious.     As per HPI. Otherwise, a complete review of systems is negative.  PAST MEDICAL HISTORY: Past Medical History:  Diagnosis Date  . Cancer ([place]) 05/09/2017   INVASIVE ADENOCARCINOMA, MODERATELY DIFFERENTIATED, ascending colon  . Dysrhythmia    tachycardia occasionally  . H/O herpes zoster   . History of Helicobacter pylori infection   . Hyperlipidemia   . Hypertension   . Iron deficiency  anemia    was on feraheme but stopped due to side effects  . Leukopenia   . Osteoarthritis involving multiple joints on both sides of body   . Osteopenia   . Subacute thyroiditis   . Tachycardia     PAST SURGICAL HISTORY: Past Surgical History:  Procedure Laterality Date  . CHOLECYSTECTOMY  2003   Dr Nicholes Stairs  . COLON RESECTION Right 06/02/2017   Laparoscopically assisted right colectomy.  pT3 pN1a, No loss of nuclear expression of MMR proteins.  Surgeon: Robert Bellow, MD;  Location: [facility] ORS;  Service: General;  Laterality: Right;  . COLONOSCOPY WITH PROPOFOL N/A 05/09/2017   Procedure: COLONOSCOPY WITH PROPOFOL;  Surgeon: Lollie Sails, MD;  Location: [facility] ENDOSCOPY;  Service: Endoscopy;  Laterality: N/A;  . ESOPHAGOGASTRODUODENOSCOPY (EGD) WITH PROPOFOL N/A 03/21/2017   Procedure: ESOPHAGOGASTRODUODENOSCOPY (EGD) WITH PROPOFOL;  Surgeon: Lollie Sails, MD;  Location: [facility] ENDOSCOPY;  Service: Endoscopy;  Laterality: N/A;  . EYE SURGERY Left    cataract surgery  . hyperlipedemia N/A   . irregular HR      FAMILY HISTORY: Family History  Problem Relation Age of Onset  . Heart disease Father   . Hypertension Father   . Heart attack Father   . Heart attack Paternal Uncle   . Heart attack Paternal Uncle   . Heart attack Paternal Uncle     ADVANCED DIRECTIVES (Y/N):  @ADVDIR @  HEALTH MAINTENANCE: Social History   Tobacco Use  . Smoking status: Former Research scientist (life sciences)  . Smokeless tobacco: Never Used  . Tobacco comment: smoked in college  Substance Use Topics  . Alcohol use: Yes    Comment: OCCASIONAL  . Drug  use: No     Colonoscopy:  PAP:  Bone density:  Lipid panel:  Allergies  Allergen Reactions  . Actonel [Risedronate Sodium] Nausea And Vomiting    Dizziness. Given 10 years ago  . Calcitonin ([place]) Nausea And Vomiting    dizziness  . Ciprofloxacin Nausea Only    Very sick within 2 days of taking antibiotic    Current Facility-Administered  Medications  Medication Dose Route Frequency Provider Last Rate Last Dose  . 0.9 % NaCl with KCl 20 mEq/ L  infusion   Intravenous Continuous Pyreddy, Reatha Harps, MD 100 mL/hr at 07/26/17 1430    . acetaminophen (TYLENOL) tablet 650 mg  650 mg Oral Q6H PRN Saundra Shelling, MD       Or  . acetaminophen (TYLENOL) suppository 650 mg  650 mg Rectal Q6H PRN Pyreddy, Reatha Harps, MD      . atorvastatin (LIPITOR) tablet 10 mg  10 mg Oral Daily Pyreddy, Pavan, MD      . calcium-vitamin D (OSCAL WITH D) 500-200 MG-UNIT per tablet   Oral Daily Pyreddy, Pavan, MD      . cholecalciferol (VITAMIN D) tablet 1,000 Units  1,000 Units Oral Daily Pyreddy, Pavan, MD      . enoxaparin (LOVENOX) injection 40 mg  40 mg Subcutaneous Q24H Pyreddy, Reatha Harps, MD   40 mg at 07/22/2017 2023  . ondansetron (ZOFRAN) tablet 4 mg  4 mg Oral Q6H PRN Saundra Shelling, MD       Or  . ondansetron (ZOFRAN) injection 4 mg  4 mg Intravenous Q6H PRN Saundra Shelling, MD   4 mg at 07/26/17 1436    OBJECTIVE: Vitals:   07/26/17 0749 07/26/17 1020  BP: (!) 155/65 (!) 148/66  Pulse:  95  Resp: 17 16  Temp: 97.8 F (36.6 C) (!) 97.4 F (36.3 C)  SpO2: 100% 100%     Body mass index is 23.43 kg/m.    ECOG FS:2 - Symptomatic, <50% confined to bed  General: Well-developed, well-nourished, no acute distress.  Sitting in bed comfortably. Eyes: Pink conjunctiva, anicteric sclera. HEENT: Normocephalic, moist mucous membranes, clear oropharnyx. Lungs: Clear to auscultation bilaterally. Heart: Regular rate and rhythm. No rubs, murmurs, or gallops. Abdomen: Soft, nontender, nondistended. No organomegaly noted, normoactive bowel sounds. Musculoskeletal: No edema, cyanosis, or clubbing. Neuro: Alert, answering all questions appropriately. Cranial nerves grossly intact. Skin: No rashes or petechiae noted. Psych: Normal affect.   LAB RESULTS:  Lab Results  Component Value Date   NA 136 07/26/2017   K 3.6 07/26/2017   CL 108 07/26/2017   CO2 20 (L)  07/26/2017   GLUCOSE 139 (H) 07/26/2017   BUN 19 07/26/2017   CREATININE 0.83 07/26/2017   CALCIUM 8.3 (L) 07/26/2017   PROT 6.1 (L) 07/30/2017   ALBUMIN 3.4 (L) 08/04/2017   AST 19 08/10/2017   ALT 11 (L) 08/05/2017   ALKPHOS 59 08/04/2017   BILITOT 0.6 08/11/2017   GFRNONAA >60 07/26/2017   GFRAA >60 07/26/2017    Lab Results  Component Value Date   WBC 3.9 07/26/2017   NEUTROABS 3.5 08/05/2017   HGB 12.8 07/26/2017   HCT 38.9 07/26/2017   MCV 84.2 07/26/2017   PLT 342 07/26/2017     STUDIES: No results found.  ASSESSMENT: Intractable nausea and vomiting, colon cancer.  PLAN:    1.  Intractable nausea and vomiting: Likely secondary to chemotherapy, mildly improved.  Continue fluid resuscitation as well as IV antiemetics.  Advance diet as tolerated. 2.  Colon  cancer: Patient had her last dose of Xeloda on July 20, 2017.  Patient has been instructed to keep her previously scheduled follow-up appointment in the cancer center. 3.  Hypokalemia: Improved.  Potassium 3.6 today.  Monitor. 4.  Dehydration: Improving, continue IV fluids as ordered. 5.  Diarrhea: C. difficile negative.  Monitor.  Appreciate consult, will follow.   Lloyd Huger, MD   07/26/2017 2:57 PM

## 2017-07-27 DIAGNOSIS — R197 Diarrhea, unspecified: Secondary | ICD-10-CM

## 2017-07-27 MED ORDER — PROMETHAZINE HCL 25 MG/ML IJ SOLN
12.5000 mg | Freq: Four times a day (QID) | INTRAMUSCULAR | Status: DC | PRN
  Administered 2017-07-27 – 2017-07-31 (×5): 12.5 mg via INTRAVENOUS
  Filled 2017-07-27 (×5): qty 1

## 2017-07-27 MED ORDER — PANTOPRAZOLE SODIUM 40 MG IV SOLR
40.0000 mg | Freq: Two times a day (BID) | INTRAVENOUS | Status: DC
  Administered 2017-07-27 – 2017-07-31 (×10): 40 mg via INTRAVENOUS
  Filled 2017-07-27 (×10): qty 40

## 2017-07-27 MED ORDER — CHOLESTYRAMINE 4 G PO PACK
4.0000 g | PACK | Freq: Two times a day (BID) | ORAL | Status: DC
  Filled 2017-07-27 (×11): qty 1

## 2017-07-27 NOTE — Progress Notes (Signed)
[facility] NAME: Sara Smith    MR#:  [id number]  DATE OF BIRTH:  Jun 17, 1935  SUBJECTIVE:  CHIEF COMPLAINT:  No chief complaint on file. very nauseous, unable to control her diarrhea (refused imodium as she says she can't take anything PO) REVIEW OF SYSTEMS:  Review of Systems  Constitutional: Negative for chills, fever and weight loss.  HENT: Negative for nosebleeds and sore throat.   Eyes: Negative for blurred vision.  Respiratory: Negative for cough, shortness of breath and wheezing.   Cardiovascular: Negative for chest pain, orthopnea, leg swelling and PND.  Gastrointestinal: Positive for diarrhea, nausea and vomiting. Negative for abdominal pain, constipation and heartburn.  Genitourinary: Negative for dysuria and urgency.  Musculoskeletal: Negative for back pain.  Skin: Negative for rash.  Neurological: Negative for dizziness, speech change, focal weakness and headaches.  Endo/Heme/Allergies: Does not bruise/bleed easily.  Psychiatric/Behavioral: Negative for depression.   DRUG ALLERGIES:   Allergies  Allergen Reactions  . Actonel [Risedronate Sodium] Nausea And Vomiting    Dizziness. Given 10 years ago  . Calcitonin ([place]) Nausea And Vomiting    dizziness  . Ciprofloxacin Nausea Only    Very sick within 2 days of taking antibiotic   VITALS:  Blood pressure (!) 162/69, pulse 94, temperature (!) 97.4 F (36.3 C), temperature source Oral, resp. rate 18, height 5\' 1"  (1.549 m), weight 56.2 kg (124 lb), SpO2 100 %. PHYSICAL EXAMINATION:  Physical Exam  Constitutional: She is oriented to person, place, and time and well-developed, well-nourished, and in no distress.  HENT:  Head: Normocephalic and atraumatic.  Eyes: Conjunctivae and EOM are normal. Pupils are equal, round, and reactive to light.  Neck: Normal range of motion. Neck supple. No tracheal deviation present. No thyromegaly present.  Cardiovascular:  Normal rate, regular rhythm and normal heart sounds.  Pulmonary/Chest: Effort normal and breath sounds normal. No respiratory distress. She has no wheezes. She exhibits no tenderness.  Abdominal: Soft. Bowel sounds are normal. She exhibits no distension. There is no tenderness.  Musculoskeletal: Normal range of motion.  Neurological: She is alert and oriented to person, place, and time. No cranial nerve deficit.  Skin: Skin is warm and dry. No rash noted.  Psychiatric: Mood and affect normal.   LABORATORY PANEL:  Female CBC Recent Labs  Lab 07/26/17 0538  WBC 3.9  HGB 12.8  HCT 38.9  PLT 342   ------------------------------------------------------------------------------------------------------------------ Chemistries  Recent Labs  Lab 08/02/2017 1315 07/26/17 0538  NA 132* 136  K 2.9* 3.6  CL 103 108  CO2 18* 20*  GLUCOSE 166* 139*  BUN 26* 19  CREATININE 1.03* 0.83  CALCIUM 8.8* 8.3*  MG 2.3  --   AST 19  --   ALT 11*  --   ALKPHOS 59  --   BILITOT 0.6  --    RADIOLOGY:  No results found. ASSESSMENT AND PLAN:  82 year old female patient with history of colon carcinoma, iron deficiency anemia, hypertension, hyperlipidemia admitted with nausea vomiting and diarrhea.  1.  Intractable nausea vomiting - likely secondary to chemotherapy, not able to tolerate any PO meds or food, c/s GI, start PPI BID IV,  2.  Hypokalemia: repleted and resolved 3.  Hyponatremia: resolved with hydration 4.  Dehydration: due to N/V, improving with hydration. 5.  Chemotherapy-induced diarrhea: GI panel and c. Diff neg. GI c/s, iv'e ordered imodium but unable to take anything PO due to intractable nausea  All the records are reviewed and case discussed with Care Management/Social Worker. Management plans discussed with the patient, NURSING and they are in agreement.  CODE STATUS: DNR  TOTAL TIME TAKING CARE OF THIS PATIENT: 35 minutes.   More than 50% of the time was spent in  counseling/coordination of care: YES  POSSIBLE D/C IN 1-2 DAYS, DEPENDING ON CLINICAL CONDITION.   Max Sane M.D on 07/27/2017 at 8:09 PM  Between 7am to 6pm - Pager - [phone]  After 6pm go to [email or web address] - password [email or web address] [facility]  [employer] [place] [facility]  Office  [phone]  CC: Primary care physician; Adin Hector, MD  Note: This dictation was prepared with Dragon dictation along with smaller phrase technology. Any transcriptional errors that result from this process are unintentional.

## 2017-07-27 NOTE — Care Management Note (Signed)
Case Management Note  Patient Details  Name: Sara Smith MRN: [id number] Date of Birth: [birth year]  Subjective/Objective: Admitted to [facility] with the diagnosis of hypokalemia. Lives with husband, Gwyndolyn Saxon [phone]). Last seen Dr. Caryl Comes 2-3 months. Prescriptions are filled at [facility] on [facility]. No home Health. No skilled nursing. No home oxygen. Takes care of all basic activities of daily living herself, drives. No medical equipment in the home. No falls. Lost 15 pounds in the last 6 months. Husband will transport                   Action/Plan: Will continue to follow for discharge plans   Expected Discharge Date:  07/26/17               Expected Discharge Plan:     In-House Referral:   yes  Discharge planning Services   yes  Post Acute Care Choice:    Choice offered to:     DME Arranged:    DME Agency:     HH Arranged:    [place] Agency:     Status of Service:     If discussed at [employer], dates discussed:    Additional Comments:  Shelbie Ammons, RN MSN CCM Care Management [phone] 07/27/2017, 11:47 AM

## 2017-07-27 NOTE — Care Management Important Message (Signed)
Important Message  Patient Details  Name: Sara Smith MRN: [id number] Date of Birth: Jun 12, 1935   Medicare Important Message Given:  Yes    Shelbie Ammons, RN 07/27/2017, 11:47 AM

## 2017-07-28 ENCOUNTER — Inpatient Hospital Stay: Payer: PPO

## 2017-07-28 ENCOUNTER — Inpatient Hospital Stay: Payer: PPO | Admitting: Oncology

## 2017-07-28 DIAGNOSIS — R112 Nausea with vomiting, unspecified: Secondary | ICD-10-CM

## 2017-07-28 DIAGNOSIS — T451X5A Adverse effect of antineoplastic and immunosuppressive drugs, initial encounter: Secondary | ICD-10-CM

## 2017-07-28 MED ORDER — KETOROLAC TROMETHAMINE 30 MG/ML IJ SOLN
15.0000 mg | Freq: Once | INTRAMUSCULAR | Status: AC
  Administered 2017-07-28: 15 mg via INTRAVENOUS
  Filled 2017-07-28: qty 1

## 2017-07-28 MED ORDER — MORPHINE SULFATE (PF) 2 MG/ML IV SOLN
1.0000 mg | Freq: Once | INTRAVENOUS | Status: AC
  Administered 2017-07-28: 1 mg via INTRAVENOUS
  Filled 2017-07-28: qty 1

## 2017-07-28 MED ORDER — MORPHINE SULFATE (PF) 2 MG/ML IV SOLN
2.0000 mg | INTRAVENOUS | Status: DC | PRN
  Administered 2017-07-28 – 2017-07-31 (×3): 2 mg via INTRAVENOUS
  Filled 2017-07-28 (×3): qty 1

## 2017-07-28 NOTE — Consult Note (Signed)
Cephas Darby, MD [address]  [place]  [place], [place] [address]  Main: [phone]  Fax: [phone] Pager: [phone]   Consultation  Referring Provider:     No ref. provider found Primary Care Physician:  Adin Hector, MD Primary Gastroenterologist:  Dr. Gustavo Lah         Reason for Consultation:     Intractable nausea, emesis, diarrhea  Date of Admission:  07/26/2017 Date of Consultation:  07/28/2017         HPI:   Sara Smith is a 82 y.o. female with recent diagnosis of right-sided stage III colon cancer, status post right hemicolectomy in 05/2017, undergoing chemotherapy with Xeloda. She is admitted with intractable nausea, emesis and diarrhea. She was found to have C. difficile antigen positive but toxin negative. She was given 2 weeks course of antibiotics. She is admitted with continuing nausea, emesis and diarrhea. Patient reports that her symptoms started after chemotherapy with Xeloda on 07/07/2017. GI consulted for further management. Her repeat stool studies came back negative for C. difficile toxin and antigen. During my interview with her today, patient reports that her symptoms have significantly improved. She is able to tolerate liquid diet, nausea is better, she had 4 formed stools, small amount today. She said she is not going back on xeloda. She is receiving Zofran and Phenergan. She has been eating Popsicles which is helping with nausea. She denies abdominal pain, rectal bleeding.   NSAIDs: None  Antiplts/Anticoagulants/Anti thrombotics: None  GI Procedures:  EGD 03/21/2017 - Z-line variable. Biopsied. - Bile gastritis. Biopsied. - Normal examined duodenum. Biopsied. DIAGNOSIS:  A. DUODENUM; COLD BIOPSY:  - DUODENAL MUCOSA WITH PRESERVED VILLOUS ARCHITECTURE AND FOCAL  BRUNNER'S GLAND HYPERPLASIA.  - NEGATIVE FOR INTRA-EPITHELIAL LYMPHOCYTOSIS, DYSPLASIA AND MALIGNANCY.   B. STOMACH, ANTRUM; COLD BIOPSY:  - MODERATE CHRONIC  GASTRITIS WITH FOCAL MILD ACTIVITY.  - MILD MUCOSAL ATROPHY AND FOCAL INTESTINAL METAPLASIA.  - NEGATIVE FOR DYSPLASIA AND MALIGNANCY.  - SEE NOTE.   C. STOMACH, BODY; COLD BIOPSY:  - MODERATE CHRONIC GASTRITIS.  - NEGATIVE FOR DYSPLASIA AND MALIGNANCY.  - SEE NOTE.   Note: Immunohistochemical stain for H. pylori is obtained and results  will be issued in an addendum.  Colonoscopy 05/09/2017 - Rule out malignancy, partially obstructing tumor in the mid ascending colon. Biopsied. Tattooed. - Diverticulosis in the sigmoid colon, in the descending colon and in the transverse colon. DIAGNOSIS:  A. COLON MASS, MID ASCENDING; COLD BIOPSY:  - INVASIVE ADENOCARCINOMA, MODERATELY DIFFERENTIATED.   DIAGNOSIS:  A. COLON, RIGHT; HEMICOLECTOMY:  - INVASIVE ADENOCARCINOMA.  - SEE CANCER SUMMARY BELOW.  - UNREMARKABLE APPENDIX.  Past Medical History:  Diagnosis Date  . Cancer ([place]) 05/09/2017   INVASIVE ADENOCARCINOMA, MODERATELY DIFFERENTIATED, ascending colon  . Dysrhythmia    tachycardia occasionally  . H/O herpes zoster   . History of Helicobacter pylori infection   . Hyperlipidemia   . Hypertension   . Iron deficiency anemia    was on feraheme but stopped due to side effects  . Leukopenia   . Osteoarthritis involving multiple joints on both sides of body   . Osteopenia   . Subacute thyroiditis   . Tachycardia     Past Surgical History:  Procedure Laterality Date  . CHOLECYSTECTOMY  2003   Dr Nicholes Stairs  . COLON RESECTION Right 06/02/2017   Laparoscopically assisted right colectomy.  pT3 pN1a, No loss of nuclear expression of MMR proteins.  Surgeon: Hervey Ard  W, MD;  Location: [facility] ORS;  Service: General;  Laterality: Right;  . COLONOSCOPY WITH PROPOFOL N/A 05/09/2017   Procedure: COLONOSCOPY WITH PROPOFOL;  Surgeon: Lollie Sails, MD;  Location: [facility] ENDOSCOPY;  Service: Endoscopy;  Laterality: N/A;  . ESOPHAGOGASTRODUODENOSCOPY (EGD) WITH PROPOFOL N/A 03/21/2017     Procedure: ESOPHAGOGASTRODUODENOSCOPY (EGD) WITH PROPOFOL;  Surgeon: Lollie Sails, MD;  Location: [facility] ENDOSCOPY;  Service: Endoscopy;  Laterality: N/A;  . EYE SURGERY Left    cataract surgery  . hyperlipedemia N/A   . irregular HR      Prior to Admission medications   Medication Sig Start Date End Date Taking? Authorizing Provider  acetaminophen (TYLENOL) 500 MG tablet Take 500 mg by mouth every 6 (six) hours as needed.   Yes [provider]  amLODipine (NORVASC) 5 MG tablet Take 1 tablet (5 mg total) by mouth daily. 06/05/17  Yes Byrnett, Forest Gleason, MD  atorvastatin (LIPITOR) 10 MG tablet Take 10 mg by mouth daily. In the morning 12/07/15 07/27/2017 Yes [provider]  azelastine (ASTELIN) 0.1 % nasal spray instill 2 sprays into each nostril twice a day for 7 days then twice a day if needed congestion 05/24/16  Yes [provider]  Calcium Carbonate-Vitamin D (CALTRATE 600+D PO) Take 1 tablet by mouth daily.    Yes [provider]  capecitabine (XELODA) 500 MG tablet Take 1,500 mg by mouth 2 (two) times daily after a meal. Take 3 tablets (1,500 mg total) by mouth 2 (two) times daily after a meal for 14 days. And then take 1 week off   Yes Sindy Guadeloupe, MD  Cholecalciferol 10000 units TABS Take 1,000 Units by mouth daily. In the morning   Yes [provider]  diphenoxylate-atropine (LOMOTIL) 2.5-0.025 MG tablet Take 1 tablet by mouth 4 (four) times daily as needed for diarrhea or loose stools. 07/07/17 08/06/17 Yes Sindy Guadeloupe, MD  lidocaine-prilocaine (EMLA) cream Apply to affected area once 07/03/17  Yes Sindy Guadeloupe, MD  LORazepam (ATIVAN) 0.5 MG tablet Take 1 tablet (0.5 mg total) by mouth every 6 (six) hours as needed (Nausea or vomiting). 07/03/17  Yes Sindy Guadeloupe, MD  ondansetron (ZOFRAN) 8 MG tablet Take 1 tablet (8 mg total) by mouth 2 (two) times daily as needed for refractory nausea / vomiting. Start on day 3 after chemotherapy.  07/03/17  Yes Sindy Guadeloupe, MD  prochlorperazine (COMPAZINE) 10 MG tablet Take 1 tablet (10 mg total) by mouth every 6 (six) hours as needed (Nausea or vomiting). 07/03/17  Yes Sindy Guadeloupe, MD  tretinoin (RETIN-A) 0.1 % cream Apply 1 application topically at bedtime as needed (SKIN DISCOLORATION). Uses every other night or so on face due to spot on nose that darkens 12/07/15  Yes [provider]  vitamin B-12 (CYANOCOBALAMIN) 500 MCG tablet Take 500 mcg by mouth daily. 1 gummie in the morning   Yes [provider]  dexamethasone (DECADRON) 4 MG tablet Take 2 tablets (8 mg total) by mouth daily. Start the day after chemotherapy for 2 days. Take with food. Patient not taking: Reported on 08/15/2017 07/03/17   Sindy Guadeloupe, MD  pantoprazole (PROTONIX) 40 MG tablet Take 1 tablet (40 mg total) by mouth 2 (two) times daily. Patient not taking: Reported on 07/22/2017 09/30/16   Sindy Guadeloupe, MD  urea (CARMOL) 10 % cream Apply topically 3 (three) times daily as needed. To skin areas Patient not taking: Reported on 08/19/2017 07/07/17  Sindy Guadeloupe, MD    Family History  Problem Relation Age of Onset  . Heart disease Father   . Hypertension Father   . Heart attack Father   . Heart attack Paternal Uncle   . Heart attack Paternal Uncle   . Heart attack Paternal Uncle      Social History   Tobacco Use  . Smoking status: Former Research scientist (life sciences)  . Smokeless tobacco: Never Used  . Tobacco comment: smoked in college  Substance Use Topics  . Alcohol use: Yes    Comment: OCCASIONAL  . Drug use: No    Allergies as of 08/07/2017 - Review Complete 07/29/2017  Allergen Reaction Noted  . Actonel [risedronate sodium] Nausea And Vomiting 03/20/2017  . Calcitonin ([place]) Nausea And Vomiting 04/29/2004  . Ciprofloxacin Nausea Only 08/25/2014    Review of Systems:    All systems reviewed and negative except where noted in HPI.   Physical Exam:  Vital signs in last 24 hours: Temp:  [97.4  F (36.3 C)-98.2 F (36.8 C)] 97.5 F (36.4 C) (03/08 1124) Pulse Rate:  [91-95] 95 (03/08 1124) Resp:  [16-18] 16 (03/08 1124) BP: (149-162)/(61-69) 149/67 (03/08 1124) SpO2:  [98 %-100 %] 98 % (03/08 1124) Last BM Date: 07/27/17 General:   Pleasant, cooperative in NAD, ill-appearing, thin built Head:  Normocephalic and atraumatic. Eyes:   No icterus.   Conjunctiva pink. PERRLA. Ears:  Normal auditory acuity. Neck:  Supple; no masses or thyroidomegaly Lungs: Respirations even and unlabored. Lungs clear to auscultation bilaterally.   No wheezes, crackles, or rhonchi.  Heart:  Regular rate and rhythm;  Without murmur, clicks, rubs or gallops Abdomen:  Soft, nondistended, nontender. Normal bowel sounds. No appreciable masses or hepatomegaly.  No rebound or guarding.  Rectal:  Not performed. Msk:  Symmetrical without gross deformities. Generalized weakness Extremities:  Without edema, cyanosis or clubbing. Neurologic:  Alert and oriented x3;  grossly normal neurologically. Skin:  Intact without significant lesions or rashes. Psych:  Alert and cooperative. Normal affect.  LAB RESULTS: CBC Latest Ref Rng & Units 07/26/2017 07/26/2017 07/19/2017  WBC 3.6 - 11.0 K/uL 3.9 5.6 10.5  Hemoglobin 12.0 - 16.0 g/dL 12.8 14.7 12.4  Hematocrit 35.0 - 47.0 % 38.9 44.6 37.1  Platelets 150 - 440 K/uL 342 430 277    BMET BMP Latest Ref Rng & Units 07/26/2017 08/13/2017 07/21/2017  Glucose 65 - 99 mg/dL 139(H) 166(H) 133(H)  BUN 6 - 20 mg/dL 19 26(H) 13  Creatinine 0.44 - 1.00 mg/dL 0.83 1.03(H) 0.67  Sodium 135 - 145 mmol/L 136 132(L) 133(L)  Potassium 3.5 - 5.1 mmol/L 3.6 2.9(L) 3.6  Chloride 101 - 111 mmol/L 108 103 103  CO2 22 - 32 mmol/L 20(L) 18(L) 23  Calcium 8.9 - 10.3 mg/dL 8.3(L) 8.8(L) 9.2    LFT Hepatic Function Latest Ref Rng & Units 07/26/2017 07/19/2017 07/07/2017  Total Protein 6.5 - 8.1 g/dL 6.1(L) 6.9 7.4  Albumin 3.5 - 5.0 g/dL 3.4(L) 3.8 4.6  AST 15 - 41 U/L _0 ALT 14 - 54 U/L  11(L) 17 24  Alk Phosphatase 38 - 126 U/L 59 68 81  Total Bilirubin 0.3 - 1.2 mg/dL 0.6 0.6 0.5     STUDIES: No results found.    Impression / Plan:   Sara Smith is a 82 y.o. female with Stage III colon ca s/p 1 cycle of adjuvant xeloda/ oxaliplatin admitted for xeloda induced nausea vomitting and diarrhea. Stool studies negative for  infection. Clinically improving  - She may have a component of bile salt diarrhea secondary to right hemicolectomy. Recommend trial of cholestyramine 4 g 1-2 times daily - Recommend Zofran and Phenergan every 4-6 hours - Advance diet as tolerated - Recommend trial of Zyprexa for chemotherapy induced nausea/emesis - No further workup is recommended from GI standpoint  G a sign of, please call us back with questions as concerns   Thank you for involving me in the care of this patient.      LOS: 3 days   Sherri Sear, MD  07/28/2017, 2:50 PM   Note: This dictation was prepared with Dragon dictation along with smaller phrase technology. Any transcriptional errors that result from this process are unintentional.

## 2017-07-28 NOTE — Progress Notes (Signed)
Hematology/Oncology Consult note [facility]  [phone] [phone]  Patient Care Team: Adin Hector, MD as PCP - General (Internal Medicine) Bary Castilla Forest Gleason, MD (General Surgery) Lyman Speller, RN as [place] Management   Name of the patient: Sara Smith  [id number]  20-Nov-1935    Interval history- feels a little improved since admission. Nausea persists. Diarrhea is better but PO intake remains poor. Feels fatigued. Denies any pain   Review of systems- Review of Systems  Constitutional: Positive for malaise/fatigue.  Gastrointestinal: Positive for diarrhea, nausea and vomiting.      Allergies  Allergen Reactions  . Actonel [Risedronate Sodium] Nausea And Vomiting    Dizziness. Given 10 years ago  . Calcitonin ([place]) Nausea And Vomiting    dizziness  . Ciprofloxacin Nausea Only    Very sick within 2 days of taking antibiotic     Past Medical History:  Diagnosis Date  . Cancer ([place]) 05/09/2017   INVASIVE ADENOCARCINOMA, MODERATELY DIFFERENTIATED, ascending colon  . Dysrhythmia    tachycardia occasionally  . H/O herpes zoster   . History of Helicobacter pylori infection   . Hyperlipidemia   . Hypertension   . Iron deficiency anemia    was on feraheme but stopped due to side effects  . Leukopenia   . Osteoarthritis involving multiple joints on both sides of body   . Osteopenia   . Subacute thyroiditis   . Tachycardia      Past Surgical History:  Procedure Laterality Date  . CHOLECYSTECTOMY  2003   Dr Nicholes Stairs  . COLON RESECTION Right 06/02/2017   Laparoscopically assisted right colectomy.  pT3 pN1a, No loss of nuclear expression of MMR proteins.  Surgeon: Robert Bellow, MD;  Location: [facility] ORS;  Service: General;  Laterality: Right;  . COLONOSCOPY WITH PROPOFOL N/A 05/09/2017   Procedure: COLONOSCOPY WITH PROPOFOL;  Surgeon: Lollie Sails, MD;  Location: [facility]  ENDOSCOPY;  Service: Endoscopy;  Laterality: N/A;  . ESOPHAGOGASTRODUODENOSCOPY (EGD) WITH PROPOFOL N/A 03/21/2017   Procedure: ESOPHAGOGASTRODUODENOSCOPY (EGD) WITH PROPOFOL;  Surgeon: Lollie Sails, MD;  Location: [facility] ENDOSCOPY;  Service: Endoscopy;  Laterality: N/A;  . EYE SURGERY Left    cataract surgery  . hyperlipedemia N/A   . irregular HR      Social History   Socioeconomic History  . Marital status: Married    Spouse name: Not on file  . Number of children: Not on file  . Years of education: Not on file  . Highest education level: Not on file  Social Needs  . Financial resource strain: Not hard at all  . Food insecurity - worry: Never true  . Food insecurity - inability: Never true  . Transportation needs - medical: No  . Transportation needs - non-medical: No  Occupational History  . Occupation: retired  Tobacco Use  . Smoking status: Former Research scientist (life sciences)  . Smokeless tobacco: Never Used  . Tobacco comment: smoked in college  Substance and Sexual Activity  . Alcohol use: Yes    Comment: OCCASIONAL  . Drug use: No  . Sexual activity: No  Other Topics Concern  . Not on file  Social History Narrative   Lives with husband at home    Family History  Problem Relation Age of Onset  . Heart disease Father   . Hypertension Father   . Heart attack Father   . Heart attack Paternal Uncle   . Heart attack Paternal  Uncle   . Heart attack Paternal Uncle      Current Facility-Administered Medications:  .  0.9 % NaCl with KCl 20 mEq/ L  infusion, , Intravenous, Continuous, Pyreddy, Pavan, MD, Last Rate: 100 mL/hr at 07/27/17 2218 .  acetaminophen (TYLENOL) tablet 650 mg, 650 mg, Oral, Q6H PRN **OR** acetaminophen (TYLENOL) suppository 650 mg, 650 mg, Rectal, Q6H PRN, Pyreddy, Pavan, MD .  atorvastatin (LIPITOR) tablet 10 mg, 10 mg, Oral, Daily, Pyreddy, Pavan, MD .  calcium-vitamin D (OSCAL WITH D) 500-200 MG-UNIT per tablet, , Oral, Daily, Pyreddy, Pavan, MD .   cholecalciferol (VITAMIN D) tablet 1,000 Units, 1,000 Units, Oral, Daily, Pyreddy, Pavan, MD .  cholestyramine (QUESTRAN) packet 4 g, 4 g, Oral, BID, Vanga, Tally Due, MD .  enoxaparin (LOVENOX) injection 40 mg, 40 mg, Subcutaneous, Q24H, Pyreddy, Pavan, MD, 40 mg at 07/27/17 2219 .  loperamide (IMODIUM) capsule 2 mg, 2 mg, Oral, Q6H PRN, Max Sane, MD .  multivitamin with minerals tablet 1 tablet, 1 tablet, Oral, Daily, Manuella Ghazi, Vipul, MD .  ondansetron (ZOFRAN) tablet 4 mg, 4 mg, Oral, Q6H PRN **OR** ondansetron (ZOFRAN) injection 4 mg, 4 mg, Intravenous, Q6H PRN, Pyreddy, Pavan, MD, 4 mg at 07/27/17 1210 .  pantoprazole (PROTONIX) injection 40 mg, 40 mg, Intravenous, Q12H, Max Sane, MD, 40 mg at 07/27/17 2220 .  promethazine (PHENERGAN) injection 12.5 mg, 12.5 mg, Intravenous, Q6H PRN, Max Sane, MD, 12.5 mg at 07/27/17 2218 .  protein supplement (UNJURY CHICKEN SOUP) powder 2 oz, 2 oz, Oral, TID, Max Sane, MD  Physical exam:  Vitals:   07/27/17 0451 07/27/17 1502 07/27/17 1934 07/28/17 0448  BP: (!) 154/64 (!) 157/61 (!) 162/69 (!) 158/67  Pulse: 93 91 94 92  Resp: 18  18 18   Temp: 98 F (36.7 C) 97.6 F (36.4 C) (!) 97.4 F (36.3 C) 98.2 F (36.8 C)  TempSrc: Oral Oral Oral Oral  SpO2: 100% 100% 100% 99%  Weight:      Height:       Physical Exam  Constitutional: She is oriented to person, place, and time.  Thin elderly female. Appears fatigued  HENT:  Head: Normocephalic and atraumatic.  Mucous membranes dry  Eyes: EOM are normal. Pupils are equal, round, and reactive to light.  Neck: Normal range of motion.  Cardiovascular: Normal rate, regular rhythm and normal heart sounds.  Pulmonary/Chest: Effort normal and breath sounds normal.  Abdominal: Soft. Bowel sounds are normal.  Neurological: She is alert and oriented to person, place, and time.  Skin: Skin is warm and dry.     CMP Latest Ref Rng & Units 07/26/2017  Glucose 65 - 99 mg/dL 139(H)  BUN 6 - 20 mg/dL  19  Creatinine 0.44 - 1.00 mg/dL 0.83  Sodium 135 - 145 mmol/L 136  Potassium 3.5 - 5.1 mmol/L 3.6  Chloride 101 - 111 mmol/L 108  CO2 22 - 32 mmol/L 20(L)  Calcium 8.9 - 10.3 mg/dL 8.3(L)  Total Protein 6.5 - 8.1 g/dL -  Total Bilirubin 0.3 - 1.2 mg/dL -  Alkaline Phos 38 - 126 U/L -  AST 15 - 41 U/L -  ALT 14 - 54 U/L -   CBC Latest Ref Rng & Units 07/26/2017  WBC 3.6 - 11.0 K/uL 3.9  Hemoglobin 12.0 - 16.0 g/dL 12.8  Hematocrit 35.0 - 47.0 % 38.9  Platelets 150 - 440 K/uL 342      Assessment and plan- Patient is a 82 y.o. female with Stage III  colon ca s/p 1 cycle of adjuvant xeloda/ oxaliplatin admitted for xeloda induced nausea vomitting and diarrhea    nausea/ vomiting/ diarrhea- recommend adding olanzapine 10 mg QHS (if no drug inteaction/ Qtc prolongation risk) for better control of nausea. Continue either prn zofran or pheregan whichever is helping more. Continue IV fluids. Natural course should show improvement in next few days.  Repeat c diff negative continue imodium for diarrhea.  Will follow   Visit Diagnosis Chemo induced nausea/vomiting   Dr. Randa Evens, MD, MPH [facility] Pager- [phone] 07/28/2017 7:48 AM

## 2017-07-28 NOTE — Plan of Care (Signed)
  Progressing Education: Knowledge of General Education information will improve 07/28/2017 1801 - Progressing by Daylene Posey, RN Health Behavior/Discharge Planning: Ability to manage health-related needs will improve 07/28/2017 1801 - Progressing by Daylene Posey, RN Clinical Measurements: Ability to maintain clinical measurements within normal limits will improve 07/28/2017 1801 - Progressing by Daylene Posey, RN Will remain free from infection 07/28/2017 1801 - Progressing by Daylene Posey, RN Diagnostic test results will improve 07/28/2017 1801 - Progressing by Daylene Posey, RN Respiratory complications will improve 07/28/2017 1801 - Progressing by Daylene Posey, RN Cardiovascular complication will be avoided 07/28/2017 1801 - Progressing by Daylene Posey, RN Activity: Risk for activity intolerance will decrease 07/28/2017 1801 - Progressing by Daylene Posey, RN Nutrition: Adequate nutrition will be maintained 07/28/2017 1801 - Progressing by Daylene Posey, RN Coping: Level of anxiety will decrease 07/28/2017 1801 - Progressing by Daylene Posey, RN Elimination: Will not experience complications related to bowel motility 07/28/2017 1801 - Progressing by Daylene Posey, RN Will not experience complications related to urinary retention 07/28/2017 1801 - Progressing by Daylene Posey, RN Pain Managment: General experience of comfort will improve 07/28/2017 1801 - Progressing by Daylene Posey, RN Safety: Ability to remain free from injury will improve 07/28/2017 1801 - Progressing by Daylene Posey, RN Skin Integrity: Risk for impaired skin integrity will decrease 07/28/2017 1801 - Progressing by Daylene Posey, RN

## 2017-07-28 NOTE — Progress Notes (Signed)
[place] at [place] NAME: Leon Goodnow    MR#:  [id number]  DATE OF BIRTH:  [birth year]  SUBJECTIVE:  CHIEF COMPLAINT:  No chief complaint on file. slowly improving although still afraid to take anything PO, wants to have something stronger for pain REVIEW OF SYSTEMS:  Review of Systems  Constitutional: Negative for chills, fever and weight loss.  HENT: Negative for nosebleeds and sore throat.   Eyes: Negative for blurred vision.  Respiratory: Negative for cough, shortness of breath and wheezing.   Cardiovascular: Negative for chest pain, orthopnea, leg swelling and PND.  Gastrointestinal: Positive for diarrhea, nausea and vomiting. Negative for abdominal pain, constipation and heartburn.  Genitourinary: Negative for dysuria and urgency.  Musculoskeletal: Negative for back pain.  Skin: Negative for rash.  Neurological: Negative for dizziness, speech change, focal weakness and headaches.  Endo/Heme/Allergies: Does not bruise/bleed easily.  Psychiatric/Behavioral: Negative for depression.   DRUG ALLERGIES:   Allergies  Allergen Reactions  . Actonel [Risedronate Sodium] Nausea And Vomiting    Dizziness. Given 10 years ago  . Calcitonin ([place]) Nausea And Vomiting    dizziness  . Ciprofloxacin Nausea Only    Very sick within 2 days of taking antibiotic   VITALS:  Blood pressure (!) 149/67, pulse 95, temperature (!) 97.5 F (36.4 C), temperature source Oral, resp. rate 16, height 5\' 1"  (1.549 m), weight 56.2 kg (124 lb), SpO2 97 %. PHYSICAL EXAMINATION:  Physical Exam  Constitutional: She is oriented to person, place, and time and well-developed, well-nourished, and in no distress.  HENT:  Head: Normocephalic and atraumatic.  Eyes: Conjunctivae and EOM are normal. Pupils are equal, round, and reactive to light.  Neck: Normal range of motion. Neck supple. No tracheal deviation present. No thyromegaly present.  Cardiovascular:  Normal rate, regular rhythm and normal heart sounds.  Pulmonary/Chest: Effort normal and breath sounds normal. No respiratory distress. She has no wheezes. She exhibits no tenderness.  Abdominal: Soft. Bowel sounds are normal. She exhibits no distension. There is no tenderness.  Musculoskeletal: Normal range of motion.  Neurological: She is alert and oriented to person, place, and time. No cranial nerve deficit.  Skin: Skin is warm and dry. No rash noted.  Psychiatric: Mood and affect normal.   LABORATORY PANEL:  Female CBC Recent Labs  Lab 07/26/17 0538  WBC 3.9  HGB 12.8  HCT 38.9  PLT 342   ------------------------------------------------------------------------------------------------------------------ Chemistries  Recent Labs  Lab 08/15/2017 1315 07/26/17 0538  NA 132* 136  K 2.9* 3.6  CL 103 108  CO2 18* 20*  GLUCOSE 166* 139*  BUN 26* 19  CREATININE 1.03* 0.83  CALCIUM 8.8* 8.3*  MG 2.3  --   AST 19  --   ALT 11*  --   ALKPHOS 59  --   BILITOT 0.6  --    RADIOLOGY:  No results found. ASSESSMENT AND PLAN:  82 year old female patient with history of colon carcinoma, iron deficiency anemia, hypertension, hyperlipidemia admitted with nausea vomiting and diarrhea.  1.  Intractable nausea vomiting secondary to chemotherapy - trial of Zyprexa for chemotherapy induced nausea/emesis 2.  Hypokalemia: repleted and resolved 3.  Hyponatremia: resolved with hydration 4.  Dehydration: due to N/V, improving with hydration. 5.  Chemotherapy-induced diarrhea: GI panel and c. Diff neg. Per GI this could be possibly bile salt diarrhea secondary to right hemicolectomy. They Recommend trial of cholestyramine 4 g 1-2 times daily      All the  records are reviewed and case discussed with Care Management/Social Worker. Management plans discussed with the patient, family (husband) and they are in agreement.  CODE STATUS: DNR  TOTAL TIME TAKING CARE OF THIS PATIENT: 35 minutes.    More than 50% of the time was spent in counseling/coordination of care: YES  POSSIBLE D/C IN 1-2 DAYS, DEPENDING ON CLINICAL CONDITION.   Max Sane M.D on 07/28/2017 at 6:28 PM  Between 7am to 6pm - Pager - [phone]  After 6pm go to [email or web address] - password [email or web address] [facility]  [employer] [place] [facility]  Office  [phone]  CC: Primary care physician; Adin Hector, MD  Note: This dictation was prepared with Dragon dictation along with smaller phrase technology. Any transcriptional errors that result from this process are unintentional.

## 2017-07-29 LAB — CBC
HCT: 38.2 % (ref 35.0–47.0)
HEMOGLOBIN: 12.4 g/dL (ref 12.0–16.0)
MCH: 27.5 pg (ref 26.0–34.0)
MCHC: 32.4 g/dL (ref 32.0–36.0)
MCV: 84.9 fL (ref 80.0–100.0)
Platelets: 306 10*3/uL (ref 150–440)
RBC: 4.5 MIL/uL (ref 3.80–5.20)
RDW: 28.8 % — ABNORMAL HIGH (ref 11.5–14.5)
WBC: 7.1 10*3/uL (ref 3.6–11.0)

## 2017-07-29 LAB — BASIC METABOLIC PANEL
Anion gap: 4 — ABNORMAL LOW (ref 5–15)
BUN: 8 mg/dL (ref 6–20)
CHLORIDE: 109 mmol/L (ref 101–111)
CO2: 23 mmol/L (ref 22–32)
CREATININE: 0.65 mg/dL (ref 0.44–1.00)
Calcium: 7.9 mg/dL — ABNORMAL LOW (ref 8.9–10.3)
GFR calc non Af Amer: >60 mL/min (ref >60)
Glucose, Bld: 124 mg/dL — ABNORMAL HIGH (ref 65–99)
Potassium: 3.4 mmol/L — ABNORMAL LOW (ref 3.5–5.1)
Sodium: 136 mmol/L (ref 135–145)

## 2017-07-29 MED ORDER — CHOLESTYRAMINE 4 G PO PACK: 4.0000 g | PACK | Freq: Two times a day (BID) | ORAL | Status: DC

## 2017-07-29 NOTE — Progress Notes (Signed)
[place] at [place] NAME: Sara Smith    MR#:  [id number]  DATE OF BIRTH:  Aug 07, 1935  SUBJECTIVE:  CHIEF COMPLAINT:  No chief complaint on file. slowly improving , tolerating liquids, agreed to try soft diet today.  REVIEW OF SYSTEMS:  Review of Systems  Constitutional: Negative for chills, fever and weight loss.  HENT: Negative for nosebleeds and sore throat.   Eyes: Negative for blurred vision.  Respiratory: Negative for cough, shortness of breath and wheezing.   Cardiovascular: Negative for chest pain, orthopnea, leg swelling and PND.  Gastrointestinal: Positive for diarrhea, nausea and vomiting. Negative for abdominal pain, constipation and heartburn.  Genitourinary: Negative for dysuria and urgency.  Musculoskeletal: Negative for back pain.  Skin: Negative for rash.  Neurological: Negative for dizziness, speech change, focal weakness and headaches.  Endo/Heme/Allergies: Does not bruise/bleed easily.  Psychiatric/Behavioral: Negative for depression.   DRUG ALLERGIES:   Allergies  Allergen Reactions  . Actonel [Risedronate Sodium] Nausea And Vomiting    Dizziness. Given 10 years ago  . Calcitonin ([place]) Nausea And Vomiting    dizziness  . Ciprofloxacin Nausea Only    Very sick within 2 days of taking antibiotic   VITALS:  Blood pressure (!) 153/69, pulse (!) 103, temperature 97.8 F (36.6 C), temperature source Oral, resp. rate 18, height 5\' 1"  (1.549 m), weight 56.2 kg (124 lb), SpO2 99 %. PHYSICAL EXAMINATION:  Physical Exam  Constitutional: She is oriented to person, place, and time and well-developed, well-nourished, and in no distress.  HENT:  Head: Normocephalic and atraumatic.  Eyes: Conjunctivae and EOM are normal. Pupils are equal, round, and reactive to light.  Neck: Normal range of motion. Neck supple. No tracheal deviation present. No thyromegaly present.  Cardiovascular: Normal rate, regular rhythm and  normal heart sounds.  Pulmonary/Chest: Effort normal and breath sounds normal. No respiratory distress. She has no wheezes. She exhibits no tenderness.  Abdominal: Soft. Bowel sounds are normal. She exhibits no distension. There is no tenderness.  Musculoskeletal: Normal range of motion.  Neurological: She is alert and oriented to person, place, and time. No cranial nerve deficit.  Skin: Skin is warm and dry. No rash noted.  Psychiatric: Mood and affect normal.   LABORATORY PANEL:  Female CBC Recent Labs  Lab 07/29/17 0408  WBC 7.1  HGB 12.4  HCT 38.2  PLT 306   ------------------------------------------------------------------------------------------------------------------ Chemistries  Recent Labs  Lab 08/19/2017 1315  07/29/17 0408  NA 132*   < > 136  K 2.9*   < > 3.4*  CL 103   < > 109  CO2 18*   < > 23  GLUCOSE 166*   < > 124*  BUN 26*   < > 8  CREATININE 1.03*   < > 0.65  CALCIUM 8.8*   < > 7.9*  MG 2.3  --   --   AST 19  --   --   ALT 11*  --   --   ALKPHOS 59  --   --   BILITOT 0.6  --   --    < > = values in this interval not displayed.   RADIOLOGY:  No results found. ASSESSMENT AND PLAN:  82 year old female patient with history of colon carcinoma, iron deficiency anemia, hypertension, hyperlipidemia admitted with nausea vomiting and diarrhea.  1.  Intractable nausea vomiting secondary to chemotherapy - trial of Zyprexa for chemotherapy induced nausea/emesis 2.  Hypokalemia: repleted and resolved  3.  Hyponatremia: resolved with hydration 4.  Dehydration: due to N/V, improving with hydration. 5.  Chemotherapy-induced diarrhea: GI panel and c. Diff neg. Per GI this could be possibly bile salt diarrhea secondary to right hemicolectomy. They Recommend trial of cholestyramine 4 g 1-2 times daily   She has been tolerating liquids, I have encouraged to try soft diet today. Patient has not been much more mobile since came here, at home baseline she walks without any  support, I have encouraged to ambulate today and possibly discharge tomorrow if she can tolerate the diet.     All the records are reviewed and case discussed with Care Management/Social Worker. Management plans discussed with the patient, family (husband) and they are in agreement.  CODE STATUS: DNR  TOTAL TIME TAKING CARE OF THIS PATIENT: 35 minutes.   More than 50% of the time was spent in counseling/coordination of care: YES  POSSIBLE D/C IN 1-2 DAYS, DEPENDING ON CLINICAL CONDITION.   [name] M.D on 07/29/2017 at 2:45 PM  Between 7am to 6pm - Pager - [phone]  After 6pm go to [email or web address] - password [email or web address] [email or web address]  [employer] [place] [facility]  Office  [phone]  CC: Primary care physician; Adin Hector, MD  Note: This dictation was prepared with Dragon dictation along with smaller phrase technology. Any transcriptional errors that result from this process are unintentional.

## 2017-07-30 LAB — URINALYSIS, COMPLETE (UACMP) WITH MICROSCOPIC
Glucose, UA: NEGATIVE mg/dL
HGB URINE DIPSTICK: NEGATIVE
Ketones, ur: 20 mg/dL — AB
LEUKOCYTES UA: NEGATIVE
NITRITE: NEGATIVE
Protein, ur: 30 mg/dL — AB
SPECIFIC GRAVITY, URINE: 1.025 (ref 1.005–1.030)
pH: 5 (ref 5.0–8.0)

## 2017-07-30 MED ORDER — ORAL CARE MOUTH RINSE: 15.0000 mL | Freq: Two times a day (BID) | OROMUCOSAL | Status: DC

## 2017-07-30 NOTE — Plan of Care (Signed)
Poor appetite. Unable to tolerate soft diet. C/o n/v, zofran and phenergan given with improvement. Pt tolerates clear liquid. Pt refuses PO meds, reported difficulty swallowing, Dr Anselm Jungling is aware. Had several loose stools during the shift, refuses imodium.

## 2017-07-30 NOTE — Progress Notes (Signed)
[place] at [place] NAME: Sara Smith    MR#:  [id number]  DATE OF BIRTH:  1936-03-02  SUBJECTIVE:  CHIEF COMPLAINT:  No chief complaint on file. slowly improving , tolerating liquids, agreed to try soft diet today.   She had vomited once again today, continued to have significant nausea and specially when the food or medication or is passing through her pharynx.  REVIEW OF SYSTEMS:  Review of Systems  Constitutional: Negative for chills, fever and weight loss.  HENT: Negative for nosebleeds and sore throat.   Eyes: Negative for blurred vision.  Respiratory: Negative for cough, shortness of breath and wheezing.   Cardiovascular: Negative for chest pain, orthopnea, leg swelling and PND.  Gastrointestinal: Positive for diarrhea, nausea and vomiting. Negative for abdominal pain, constipation and heartburn.  Genitourinary: Negative for dysuria and urgency.  Musculoskeletal: Negative for back pain.  Skin: Negative for rash.  Neurological: Negative for dizziness, speech change, focal weakness and headaches.  Endo/Heme/Allergies: Does not bruise/bleed easily.  Psychiatric/Behavioral: Negative for depression.   DRUG ALLERGIES:   Allergies  Allergen Reactions  . Actonel [Risedronate Sodium] Nausea And Vomiting    Dizziness. Given 10 years ago  . Calcitonin ([place]) Nausea And Vomiting    dizziness  . Ciprofloxacin Nausea Only    Very sick within 2 days of taking antibiotic   VITALS:  Blood pressure (!) 148/74, pulse (!) 106, temperature 97.7 F (36.5 C), temperature source Oral, resp. rate 18, height 5\' 1"  (1.549 m), weight 56.2 kg (124 lb), SpO2 98 %. PHYSICAL EXAMINATION:  Physical Exam  Constitutional: She is oriented to person, place, and time and well-developed, well-nourished, and in no distress.  HENT:  Head: Normocephalic and atraumatic.  Eyes: Conjunctivae and EOM are normal. Pupils are equal, round, and reactive to  light.  Neck: Normal range of motion. Neck supple. No tracheal deviation present. No thyromegaly present.  Cardiovascular: Normal rate, regular rhythm and normal heart sounds.  Pulmonary/Chest: Effort normal and breath sounds normal. No respiratory distress. She has no wheezes. She exhibits no tenderness.  Abdominal: Soft. Bowel sounds are normal. She exhibits no distension. There is no tenderness.  Musculoskeletal: Normal range of motion.  Neurological: She is alert and oriented to person, place, and time. No cranial nerve deficit.  Skin: Skin is warm and dry. No rash noted.  Psychiatric: Mood and affect normal.   LABORATORY PANEL:  Female CBC Recent Labs  Lab 07/29/17 0408  WBC 7.1  HGB 12.4  HCT 38.2  PLT 306   ------------------------------------------------------------------------------------------------------------------ Chemistries  Recent Labs  Lab 08/02/2017 1315  07/29/17 0408  NA 132*   < > 136  K 2.9*   < > 3.4*  CL 103   < > 109  CO2 18*   < > 23  GLUCOSE 166*   < > 124*  BUN 26*   < > 8  CREATININE 1.03*   < > 0.65  CALCIUM 8.8*   < > 7.9*  MG 2.3  --   --   AST 19  --   --   ALT 11*  --   --   ALKPHOS 59  --   --   BILITOT 0.6  --   --    < > = values in this interval not displayed.   RADIOLOGY:  No results found. ASSESSMENT AND PLAN:  82 year old female patient with history of colon carcinoma, iron deficiency anemia, hypertension, hyperlipidemia admitted with nausea  vomiting and diarrhea.  1.  Intractable nausea vomiting secondary to chemotherapy - trial of Zyprexa for chemotherapy induced nausea/emesis 2.  Hypokalemia: repleted and resolved 3.  Hyponatremia: resolved with hydration 4.  Dehydration: due to N/V, improving with hydration. 5.  Chemotherapy-induced diarrhea: GI panel and c. Diff neg. Per GI this could be possibly bile salt diarrhea secondary to right hemicolectomy. They Recommend trial of cholestyramine 4 g 1-2 times daily   She has been  tolerating liquids, I have encouraged to try soft diet today.  She had UTI with Citrobacter in last 2 weeks, she did not receive any antibiotics. Her physician was worried about her getting C. Difficile. I will recheck her UA and urine culture and if it is still positive we may benefit by treating that er symptoms could be secondary to that.     All the records are reviewed and case discussed with Care Management/Social Worker. Management plans discussed with the patient, family (husband) and they are in agreement.  CODE STATUS: DNR  TOTAL TIME TAKING CARE OF THIS PATIENT: 35 minutes.   More than 50% of the time was spent in counseling/coordination of care: YES  POSSIBLE D/C IN 1-2 DAYS, DEPENDING ON CLINICAL CONDITION.   [name] M.D on 07/30/2017 at 3:26 PM  Between 7am to 6pm - Pager - [phone]  After 6pm go to [email or web address] - password [email or web address] [facility]  [employer]  [facility]ospital[facility]  Office  [phone]  CC: Primary care physician; Adin Hector, MD  Note: This dictation was prepared with Dragon dictation along with smaller phrase technology. Any transcriptional errors that result from this process are unintentional.

## 2017-07-31 DIAGNOSIS — L271 Localized skin eruption due to drugs and medicaments taken internally: Secondary | ICD-10-CM

## 2017-07-31 LAB — BASIC METABOLIC PANEL
ANION GAP: 12 (ref 5–15)
BUN: 18 mg/dL (ref 6–20)
CALCIUM: 8.5 mg/dL — AB (ref 8.9–10.3)
CHLORIDE: 107 mmol/L (ref 101–111)
CO2: 12 mmol/L — AB (ref 22–32)
Creatinine, Ser: 0.86 mg/dL (ref 0.44–1.00)
GFR calc Af Amer: >60 mL/min (ref >60)
GFR calc non Af Amer: >60 mL/min (ref >60)
GLUCOSE: 121 mg/dL — AB (ref 65–99)
POTASSIUM: 4.1 mmol/L (ref 3.5–5.1)
Sodium: 131 mmol/L — ABNORMAL LOW (ref 135–145)

## 2017-07-31 LAB — URINE CULTURE: Culture: <10000 — AB

## 2017-07-31 LAB — CBC
HCT: 43.3 % (ref 35.0–47.0)
HEMOGLOBIN: 13.9 g/dL (ref 12.0–16.0)
MCH: 27.4 pg (ref 26.0–34.0)
MCHC: 32.1 g/dL (ref 32.0–36.0)
MCV: 85.1 fL (ref 80.0–100.0)
PLATELETS: 343 10*3/uL (ref 150–440)
RBC: 5.09 MIL/uL (ref 3.80–5.20)
RDW: 30.3 % — ABNORMAL HIGH (ref 11.5–14.5)
WBC: 6.4 10*3/uL (ref 3.6–11.0)

## 2017-07-31 LAB — MAGNESIUM: Magnesium: 1.8 mg/dL (ref 1.7–2.4)

## 2017-07-31 MED ORDER — OLANZAPINE 10 MG PO TBDP
10.0000 mg | ORAL_TABLET | Freq: Every day | ORAL | Status: DC
  Administered 2017-07-31: 14:00:00 10 mg via ORAL
  Filled 2017-07-31 (×2): qty 1

## 2017-07-31 NOTE — Progress Notes (Signed)
Nutrition Follow-up  DOCUMENTATION CODES:   Severe malnutrition in context of acute illness/injury  INTERVENTION:   Pt has been unable to tolerate oral nutrition other than clear liquids x6 days. If continues, recommend post-pyloric NGT placement and enteral feeds.   Pt at high refeeding risk; recommend monitor K, Mg, and P labs once oral intake improved  - Continue Unjury Chicken Soup TID, each provides 100 kcal and 21 grams protein - Continue MVI with minerals daily  NUTRITION DIAGNOSIS:   Severe Malnutrition related to acute illness(cancer and related treatments) as evidenced by percent weight loss, moderate fat depletion, moderate muscle depletion, energy intake < or equal to 50% for > or equal to 5 days.  Ongoing  GOAL:   Patient will meet greater than or equal to 90% of their needs  Unmet at this time  MONITOR:   PO intake, Supplement acceptance, Diet advancement, Labs, Weight trends, I & O's  REASON FOR ASSESSMENT:   Malnutrition Screening Tool   ASSESSMENT:    82 y.o. female with adenocarcinoma of the ascending colon at least stage IIIB status post right hemicolectomy. Last dose of xeloda was on 07/20/17. Nausea/ vomiting and diarrhea- likely due to xeloda.  07/28/17 - diet advanced from clear liquid to full liquid 07/29/17 - diet advanced from full liquid to soft  Pt with several unfinished beverages in room at time of RD visit. Spoke with pt, husband, and RN during visit. Pt states she has only consumed liquids and has been unable to tolerate them. Pt's husband states that pt has consumed a few sips of Coke, lemonade, water, and cranberry juice this AM.  Pt states she has been receiving Unjury but has not been consuming most of them.  Spoke with MD regarding post pyloric NGT. MD states that since GI recommended Zyprexa for chemo-induced nausea and vomiting, will trial this medication for several days before considering enteral nutrition.  Meal Completion: 0-25%  since 07/28/17  I/O's: +7.5 L since admission  Medications reviewed and include: 10 mg Lipitor daily, Oscal with vitamin D daily, MVI with minerals, 40 mg Protonix BID, IV KCl PRN: Imodium, Zofran, Phenergan  Labs reviewed: potassium 2.3 (L), calcium 7.9 (L)  Diet Order:  DIET SOFT Room service appropriate? Yes; Fluid consistency: Thin  EDUCATION NEEDS:   Education needs have been addressed  Skin:  Skin Assessment: Reviewed RN Assessment  Last BM:  07/30/17 small type 7  Height:   Ht Readings from Last 1 Encounters:  08/11/2017 5\' 1"  (1.549 m)    Weight:   Wt Readings from Last 1 Encounters:  07/26/17 124 lb (56.2 kg)    Ideal Body Weight:  47.7 kg  BMI:  Body mass index is 23.43 kg/m.  Estimated Nutritional Needs:   Kcal:  1350-1550kcal/day   Protein:  73-85g/day   Fluid:  >1.4L/day     Gaynell Face, MS, RD, LDN Pager: [phone] Weekend/After Hours: [phone]

## 2017-07-31 NOTE — Progress Notes (Signed)
Hematology/Oncology Consult note [facility]  [phone] [phone]  Patient Care Team: Adin Hector, MD as PCP - General (Internal Medicine) Bary Castilla Forest Gleason, MD (General Surgery) Lyman Speller, RN as [place] Management   Name of the patient: Sara Smith  [id number]  08-10-1935   Date of visit: 07/31/2017  Interval history- feels fatigued. Has not been able to take anything PO except fluids. zyprexa started this afternoon. Denies any pain. Has had 2-3 small loose bowel movements  Review of systems- Review of Systems  Constitutional: Positive for malaise/fatigue. Negative for chills, fever and weight loss.  HENT: Negative for congestion, ear discharge and nosebleeds.   Eyes: Negative for blurred vision.  Respiratory: Negative for cough, hemoptysis, sputum production, shortness of breath and wheezing.   Cardiovascular: Negative for chest pain, palpitations, orthopnea and claudication.  Gastrointestinal: Positive for diarrhea, nausea and vomiting. Negative for abdominal pain, blood in stool, constipation, heartburn and melena.  Genitourinary: Negative for dysuria, flank pain, frequency, hematuria and urgency.  Musculoskeletal: Negative for back pain, joint pain and myalgias.  Skin: Negative for rash.  Neurological: Positive for weakness. Negative for dizziness, tingling, focal weakness, seizures and headaches.  Endo/Heme/Allergies: Does not bruise/bleed easily.  Psychiatric/Behavioral: Negative for depression and suicidal ideas. The patient does not have insomnia.       Allergies  Allergen Reactions  . Actonel [Risedronate Sodium] Nausea And Vomiting    Dizziness. Given 10 years ago  . Calcitonin ([place]) Nausea And Vomiting    dizziness  . Ciprofloxacin Nausea Only    Very sick within 2 days of taking antibiotic     Past Medical History:  Diagnosis Date  . Cancer ([place]) 05/09/2017   INVASIVE ADENOCARCINOMA, MODERATELY DIFFERENTIATED, ascending colon  . Dysrhythmia    tachycardia occasionally  . H/O herpes zoster   . History of Helicobacter pylori infection   . Hyperlipidemia   . Hypertension   . Iron deficiency anemia    was on feraheme but stopped due to side effects  . Leukopenia   . Osteoarthritis involving multiple joints on both sides of body   . Osteopenia   . Subacute thyroiditis   . Tachycardia      Past Surgical History:  Procedure Laterality Date  . CHOLECYSTECTOMY  2003   Dr Nicholes Stairs  . COLON RESECTION Right 06/02/2017   Laparoscopically assisted right colectomy.  pT3 pN1a, No loss of nuclear expression of MMR proteins.  Surgeon: Robert Bellow, MD;  Location: [facility] ORS;  Service: General;  Laterality: Right;  . COLONOSCOPY WITH PROPOFOL N/A 05/09/2017   Procedure: COLONOSCOPY WITH PROPOFOL;  Surgeon: Lollie Sails, MD;  Location: [facility] ENDOSCOPY;  Service: Endoscopy;  Laterality: N/A;  . ESOPHAGOGASTRODUODENOSCOPY (EGD) WITH PROPOFOL N/A 03/21/2017   Procedure: ESOPHAGOGASTRODUODENOSCOPY (EGD) WITH PROPOFOL;  Surgeon: Lollie Sails, MD;  Location: [facility] ENDOSCOPY;  Service: Endoscopy;  Laterality: N/A;  . EYE SURGERY Left    cataract surgery  . hyperlipedemia N/A   . irregular HR      Social History   Socioeconomic History  . Marital status: Married    Spouse name: Not on file  . Number of children: Not on file  . Years of education: Not on file  . Highest education level: Not on file  Social Needs  . Financial resource strain: Not hard at all  . Food insecurity - worry: Never true  . Food insecurity - inability: Never true  .  Transportation needs - medical: No  . Transportation needs - non-medical: No  Occupational History  . Occupation: retired  Tobacco Use  . Smoking status: Former Research scientist (life sciences)  . Smokeless tobacco: Never Used  . Tobacco comment: smoked in college  Substance and Sexual Activity  . Alcohol use: Yes     Comment: OCCASIONAL  . Drug use: No  . Sexual activity: No  Other Topics Concern  . Not on file  Social History Narrative   Lives with husband at home    Family History  Problem Relation Age of Onset  . Heart disease Father   . Hypertension Father   . Heart attack Father   . Heart attack Paternal Uncle   . Heart attack Paternal Uncle   . Heart attack Paternal Uncle      Current Facility-Administered Medications:  .  0.9 % NaCl with KCl 20 mEq/ L  infusion, , Intravenous, Continuous, Pyreddy, Pavan, MD, Last Rate: 100 mL/hr at 07/31/17 0759 .  acetaminophen (TYLENOL) tablet 650 mg, 650 mg, Oral, Q6H PRN **OR** acetaminophen (TYLENOL) suppository 650 mg, 650 mg, Rectal, Q6H PRN, Pyreddy, Pavan, MD .  atorvastatin (LIPITOR) tablet 10 mg, 10 mg, Oral, Daily, Pyreddy, Pavan, MD .  calcium-vitamin D (OSCAL WITH D) 500-200 MG-UNIT per tablet, , Oral, Daily, Pyreddy, Pavan, MD .  cholecalciferol (VITAMIN D) tablet 1,000 Units, 1,000 Units, Oral, Daily, Pyreddy, Pavan, MD .  cholestyramine (QUESTRAN) packet 4 g, 4 g, Oral, BID, Vanga, Tally Due, MD .  enoxaparin (LOVENOX) injection 40 mg, 40 mg, Subcutaneous, Q24H, Pyreddy, Pavan, MD, 40 mg at 07/30/17 2020 .  loperamide (IMODIUM) capsule 2 mg, 2 mg, Oral, Q6H PRN, Max Sane, MD, 2 mg at 07/29/17 1354 .  MEDLINE mouth rinse, 15 mL, Mouth Rinse, BID, [name], MD .  morphine 2 MG/ML injection 2 mg, 2 mg, Intravenous, Q4H PRN, Max Sane, MD, 2 mg at 07/31/17 0054 .  multivitamin with minerals tablet 1 tablet, 1 tablet, Oral, Daily, Manuella Ghazi, Vipul, MD .  OLANZapine zydis (ZYPREXA) disintegrating tablet 10 mg, 10 mg, Oral, Daily, [name], MD, 10 mg at 07/31/17 1406 .  ondansetron (ZOFRAN) tablet 4 mg, 4 mg, Oral, Q6H PRN **OR** ondansetron (ZOFRAN) injection 4 mg, 4 mg, Intravenous, Q6H PRN, Pyreddy, Pavan, MD, 4 mg at 07/31/17 1406 .  pantoprazole (PROTONIX) injection 40 mg, 40 mg, Intravenous, Q12H, Manuella Ghazi,  Vipul, MD, 40 mg at 07/31/17 1101 .  promethazine (PHENERGAN) injection 12.5 mg, 12.5 mg, Intravenous, Q6H PRN, Max Sane, MD, 12.5 mg at 07/31/17 0916 .  protein supplement (UNJURY CHICKEN SOUP) powder 2 oz, 2 oz, Oral, TID, Max Sane, MD  Physical exam:  Vitals:   07/30/17 1535 07/30/17 1847 07/31/17 0307 07/31/17 1304  BP: (!) 150/77 (!) 122/93 (!) 143/93 (!) 153/94  Pulse: (!) 112 (!) 116 (!) 121 (!) 128  Resp:  16 16   Temp: (!) 97.5 F (36.4 C) 97.9 F (36.6 C)  98 F (36.7 C)  TempSrc: Oral Oral    SpO2: 97% 98% 98% 98%  Weight:      Height:       Physical Exam  Constitutional: She is oriented to person, place, and time.  Appears thin frail and fatigued  HENT:  Head: Normocephalic and atraumatic.  Eyes: EOM are normal. Pupils are equal, round, and reactive to light.  Neck: Normal range of motion.  Cardiovascular: Normal rate, regular rhythm and normal heart sounds.  Pulmonary/Chest: Effort normal and breath sounds normal.  Abdominal:  Soft. Bowel sounds are normal.  Musculoskeletal:  Mild erythema noted over b/l hands and feet  Neurological: She is alert and oriented to person, place, and time.  Skin: Skin is warm and dry.     CMP Latest Ref Rng & Units 07/31/2017  Glucose 65 - 99 mg/dL 121(H)  BUN 6 - 20 mg/dL 18  Creatinine 0.44 - 1.00 mg/dL 0.86  Sodium 135 - 145 mmol/L 131(L)  Potassium 3.5 - 5.1 mmol/L 4.1  Chloride 101 - 111 mmol/L 107  CO2 22 - 32 mmol/L 12(L)  Calcium 8.9 - 10.3 mg/dL 8.5(L)  Total Protein 6.5 - 8.1 g/dL -  Total Bilirubin 0.3 - 1.2 mg/dL -  Alkaline Phos 38 - 126 U/L -  AST 15 - 41 U/L -  ALT 14 - 54 U/L -   CBC Latest Ref Rng & Units 07/31/2017  WBC 3.6 - 11.0 K/uL 6.4  Hemoglobin 12.0 - 16.0 g/dL 13.9  Hematocrit 35.0 - 47.0 % 43.3  Platelets 150 - 440 K/uL 343    @IMAGES @  No results found.   Assessment and plan- Patient is a 82 y.o. female admitted with chemo induced nausea/ vomiting for adjuvant stage III colon  cancer  Last dose of xeloda was on 07/20/17. She has still been unable to eat any solid food. Only on clear liquids. zyprexa started today and hopefully will help with nausea and appetite. She should get 10 mg QHS. Recommend continuing zofran prn Q6 ATC. If nausea not improved by tomorrow, consider switching to prn reglan to help with gastric emptying. Check for QTc. I have encouraged her to try some solid food and take nausea med 30 min prior to planned meal time. If she is unable to improve her oral intake in next couple of days- feeding tube can be considered. It would be unusual for patient to have such severe nausea 2 weeks after last dose of xeloda. Last EGD in oct 2018 revealed gastritis but no other acute findings. EGD may be considered by GI if nausea fails to improve.  Hand foot syndrome- mild erythema noted over extremities. Likely due to xeloda. Continue urea cream.   Visit Diagnosis Chemo induced nausea/ vomiting  Dr. Randa Evens, MD, MPH [place] [facility] Pager- [phone] 07/31/2017 5:03 PM

## 2017-07-31 NOTE — Progress Notes (Signed)
[place] [facility] [place] NAME: Sara Smith    MR#:  [id number]  DATE OF BIRTH:  1935-07-14  SUBJECTIVE:  CHIEF COMPLAINT:  No chief complaint on file. slowly improving , tolerating liquids, agreed to try soft diet today.   She had vomited once again today, continued to have significant nausea and specially when the food or medication or is passing through her pharynx.  not enough nutrition for last 7-10 days.  REVIEW OF SYSTEMS:  Review of Systems  Constitutional: Negative for chills, fever and weight loss.  HENT: Negative for nosebleeds and sore throat.   Eyes: Negative for blurred vision.  Respiratory: Negative for cough, shortness of breath and wheezing.   Cardiovascular: Negative for chest pain, orthopnea, leg swelling and PND.  Gastrointestinal: Positive for nausea and vomiting. Negative for abdominal pain, constipation, diarrhea and heartburn.  Genitourinary: Negative for dysuria and urgency.  Musculoskeletal: Negative for back pain.  Skin: Negative for rash.  Neurological: Negative for dizziness, speech change, focal weakness and headaches.  Endo/Heme/Allergies: Does not bruise/bleed easily.  Psychiatric/Behavioral: Negative for depression.   DRUG ALLERGIES:   Allergies  Allergen Reactions  . Actonel [Risedronate Sodium] Nausea And Vomiting    Dizziness. Given 10 years ago  . Calcitonin ([place]) Nausea And Vomiting    dizziness  . Ciprofloxacin Nausea Only    Very sick within 2 days of taking antibiotic   VITALS:  Blood pressure (!) 144/79, pulse (!) 135, temperature 98.6 F (37 C), temperature source Axillary, resp. rate 20, height 5\' 1"  (1.549 m), weight 56.2 kg (124 lb), SpO2 98 %. PHYSICAL EXAMINATION:  Physical Exam  Constitutional: She is oriented to person, place, and time and well-developed, well-nourished, and in no distress.  HENT:  Head: Normocephalic and atraumatic.  Eyes: Conjunctivae and EOM are normal.  Pupils are equal, round, and reactive to light.  Neck: Normal range of motion. Neck supple. No tracheal deviation present. No thyromegaly present.  Cardiovascular: Normal rate, regular rhythm and normal heart sounds.  Pulmonary/Chest: Effort normal and breath sounds normal. No respiratory distress. She has no wheezes. She exhibits no tenderness.  Abdominal: Soft. Bowel sounds are normal. She exhibits no distension. There is no tenderness.  Musculoskeletal: Normal range of motion.  Neurological: She is alert and oriented to person, place, and time. No cranial nerve deficit.  Skin: Skin is warm and dry. No rash noted.  Psychiatric: Mood and affect normal.   LABORATORY PANEL:  Female CBC Recent Labs  Lab 07/31/17 1212  WBC 6.4  HGB 13.9  HCT 43.3  PLT 343   ------------------------------------------------------------------------------------------------------------------ Chemistries  Recent Labs  Lab 07/31/2017 1315  07/31/17 1212  NA 132*   < > 131*  K 2.9*   < > 4.1  CL 103   < > 107  CO2 18*   < > 12*  GLUCOSE 166*   < > 121*  BUN 26*   < > 18  CREATININE 1.03*   < > 0.86  CALCIUM 8.8*   < > 8.5*  MG 2.3  --  1.8  AST 19  --   --   ALT 11*  --   --   ALKPHOS 59  --   --   BILITOT 0.6  --   --    < > = values in this interval not displayed.   RADIOLOGY:  No results found. ASSESSMENT AND PLAN:  82 year old female patient with history of colon carcinoma, iron deficiency  anemia, hypertension, hyperlipidemia admitted with nausea vomiting and diarrhea.  1.  Intractable nausea vomiting secondary to chemotherapy - trial of Zyprexa for chemotherapy induced nausea/emesis 2.  Hypokalemia: repleted and resolved 3.  Hyponatremia: resolved with hydration 4.  Dehydration: due to N/V, improving with hydration. 5.  Chemotherapy-induced diarrhea: GI panel and c. Diff neg. Per GI this could be possibly bile salt diarrhea secondary to right hemicolectomy. They Recommend trial of  cholestyramine 4 g 1-2 times daily   She has been tolerating liquids, but not much intake, If cont like this- may have to consider re-eval by GI and Feeding tube.  She had UTI with Citrobacter in last 2 weeks, she did not receive any antibiotics. Her physician was worried about her getting C. Difficile. I will recheck her UA and urine culture and if it is still positive we may benefit by treating that her symptoms could be secondary to that. Ur cx is having insignificant growth.   All the records are reviewed and case discussed with Care Management/Social Worker. Management plans discussed with the patient, family (husband) and they are in agreement.  CODE STATUS: DNR  TOTAL TIME TAKING CARE OF THIS PATIENT: 35 minutes.   More than 50% of the time was spent in counseling/coordination of care: YES  POSSIBLE D/C IN 1-2 DAYS, DEPENDING ON CLINICAL CONDITION.   [name] M.D on 07/31/2017 at 8:32 PM  Between 7am to 6pm - Pager - [phone]  After 6pm go to [email or web address] - password [email or web address] [facility]  [employer] [facility]  Office  [phone]  CC: Primary care physician; Adin Hector, MD  Note: This dictation was prepared with Dragon dictation along with smaller phrase technology. Any transcriptional errors that result from this process are unintentional.

## 2017-07-31 NOTE — Care Management Important Message (Signed)
Important Message  Patient Details  Name: Sara Smith MRN: [id number] Date of Birth: July 21, 1935   Medicare Important Message Given:  Yes    Beverly Sessions, RN 07/31/2017, 3:58 PM

## 2017-08-01 LAB — BASIC METABOLIC PANEL
ANION GAP: 13 (ref 5–15)
BUN: 28 mg/dL — ABNORMAL HIGH (ref 6–20)
CHLORIDE: 108 mmol/L (ref 101–111)
CO2: 12 mmol/L — ABNORMAL LOW (ref 22–32)
Calcium: 8.6 mg/dL — ABNORMAL LOW (ref 8.9–10.3)
Creatinine, Ser: 1.32 mg/dL — ABNORMAL HIGH (ref 0.44–1.00)
GFR, EST AFRICAN AMERICAN: 43 mL/min — AB (ref >60)
GFR, EST NON AFRICAN AMERICAN: 37 mL/min — AB (ref >60)
Glucose, Bld: 143 mg/dL — ABNORMAL HIGH (ref 65–99)
Potassium: 4.6 mmol/L (ref 3.5–5.1)
SODIUM: 133 mmol/L — AB (ref 135–145)

## 2017-08-01 LAB — CBC
HCT: 40.1 % (ref 35.0–47.0)
HEMOGLOBIN: 13.8 g/dL (ref 12.0–16.0)
MCH: 29.4 pg (ref 26.0–34.0)
MCHC: 34.3 g/dL (ref 32.0–36.0)
MCV: 85.7 fL (ref 80.0–100.0)
Platelets: 294 10*3/uL (ref 150–440)
RBC: 4.68 MIL/uL (ref 3.80–5.20)
RDW: 30.4 % — ABNORMAL HIGH (ref 11.5–14.5)
WBC: 7.1 10*3/uL (ref 3.6–11.0)

## 2017-08-01 LAB — PHOSPHORUS: PHOSPHORUS: 3.3 mg/dL (ref 2.5–4.6)

## 2017-08-01 LAB — MAGNESIUM: MAGNESIUM: 2 mg/dL (ref 1.7–2.4)

## 2017-08-03 ENCOUNTER — Other Ambulatory Visit: Payer: Self-pay | Admitting: Nurse Practitioner

## 2017-08-21 NOTE — Progress Notes (Signed)
   08-23-17 1325  Clinical Encounter Type  Visited With Family  Visit Type Follow-up  Spiritual Encounters  Spiritual Needs Emotional   Chaplain checked in with patient family.  Patient's son sitting at mother's bedside.  He reported that his father went to call family members regarding patient's death.  Son inquired about time frame available for patient to stay in room if family wants to visit.  Chaplain followed up with unit staff about son's question.  Chaplain was told that any family would need to be here by 6pm; chaplain relayed the information to son.  Son requested time alone with his mother.

## 2017-08-21 NOTE — Progress Notes (Signed)
Code blue called to patients room, patient placed on NRB when RT entered due to DNR status of patient and no further resuscitation to be done. Patient noted to have agonal respirations upon arrival.

## 2017-08-21 NOTE — Progress Notes (Signed)
[facility] consult for prayer and emotional support. CH provided emotional support and prayer. Family still arriving. [place] available upon request.

## 2017-08-21 NOTE — Progress Notes (Signed)
Received call from NT that pt was vomiting a lot. Told NT I would pull antiemetic for pt while I was in the pyxis, and would be there in a minute. While looking for pt med in pyxis code blue was called for pt. Ran to pt room and found pt in floor of bathroom with vomit all around her. DNR was confirmed. Code blue was cancelled. MD present.

## 2017-08-21 NOTE — Progress Notes (Signed)
   09-Aug-2017 1208  Clinical Encounter Type  Visited With Family;Health care provider  Visit Type Follow-up  Spiritual Encounters  Spiritual Needs Emotional   Followed up with patient spouse.  Physician sitting with spouse and patient in room, spouse is waiting for their son to arrive.  Chaplain to check back in later.

## 2017-08-21 NOTE — Progress Notes (Signed)
   08/13/2017 1130  Clinical Encounter Type  Visited With Family;Health care provider  Visit Type Code  Spiritual Encounters  Spiritual Needs Emotional;Grief support;Prayer   Chaplain responded to code blue, asked staff about patient family.  Chaplain then checked in with patient spouse who asked that chaplain stay close to patient, chaplain returned to hallway to maintain pastoral presence and offer silent prayers. Chaplain sat in while physician informed spouse of patient's death.  Chaplain accompanied spouse to patient room.  Spouse would like time alone with patient.  Chaplain will continue to monitor.

## 2017-08-21 NOTE — Death Summary Note (Signed)
DEATH SUMMARY   Patient Details  Name: Sara Smith MRN: [id number] DOB: Apr 04, 1936  Admission/Discharge Information   Admit Date:  08/19/17  Date of Death: Date of Death: 2017/08/26  Time of Death: Time of Death: 1133/09/12  Length of Stay: 7  Referring Physician: Adin Hector, MD   Reason(s) for Hospitalization   HISTORY OF PRESENT ILLNESS: Sara Smith  is a 82 y.o. female with a known history of hyperlipidemia, hypertension, colonic carcinoma, iron deficiency anemia was referred from oncology office for nausea and vomiting.  Patient has intractable nausea and vomiting unable to keep any food down the stomach.  She also has some diarrhea for the last couple of days to a week.  She was recently tested last month for C. difficile toxin which was negative but C. difficile antigen was positive.  Patient feels weak and tired secondary to poor oral intake.  She appears dry and dehydrated.  No complaints of any chest pain, shortness of breath.  No abdominal pain.  No rectal bleed.    Diagnoses  Preliminary cause of death:   Colon cancer Secondary Diagnoses (including complications and co-morbidities):  Active Problems:   Hypokalemia   Protein-calorie malnutrition, severe   Chemotherapy induced nausea and vomiting   Brief Hospital Course (including significant findings, care, treatment, and services provided and events leading to death)  [name] is a 82 y.o. year old female who recently had chemotherapy for colon cancer presented to the hospital due to nausea, vomiting and diarrhea.  Patient was found to be dehydrated with multiple H abnormalities.  She was treated with IV fluids and electrolytes replaced.  Slowly showed some improvement.  Patient was tolerating liquids.  With very slow improvement patient was continued as inpatient.  GI and oncology were consulted for further help with this complicated case.  Patient was put on scheduled antiemetics.  Also started  on Zyprexa low-dose.  GI panel and C. difficile were negative.  Patient was started on cholestyramine with some improvement.  Patient seem to be slowly worsening with ongoing chemotherapy and colon cancer.  On August 26, 2017 patient went to the bathroom for bowel movement.  There she had sudden syncopal episode.  A CODE BLUE was called.  Responded immediately.  Patient was laying on the bathroom floor with stool.  She was pulled out into the open space.  Patient was DO NOT RESUSCITATE as per her prior wishes.  She was started on IV fluid bolus, oxygen.  Vasovagal syncope was considered.  No CPR was done as patient was DNR.  Patient did not improved with above measures and was pronounced dead at 11:35 AM.  *Colon cancer *Intractable nausea vomiting secondary to chemotherapy *Hypokalemia *Hypovolemic hyponatremia *Dehydration *Chemotherapy-induced diarrhea *Severe protein calorie malnutrition    Pertinent Labs and Studies  Significant Diagnostic Studies No results found.  Microbiology Recent Results (from the past 240 hour(s))  C difficile quick scan w PCR reflex     Status: None   Collection Time: 08-19-2017  7:57 PM  Result Value Ref Range Status   C Diff antigen NEGATIVE NEGATIVE Final   C Diff toxin NEGATIVE NEGATIVE Final   C Diff interpretation No C. difficile detected.  Final    Comment: Performed at [facility], [address] [place], [place] [address]  Gastrointestinal Panel by PCR , Stool     Status: None   Collection Time: 2017-08-19  7:57 PM  Result Value Ref Range Status   Campylobacter species NOT DETECTED  NOT DETECTED Final   Plesimonas shigelloides NOT DETECTED NOT DETECTED Final   Salmonella species NOT DETECTED NOT DETECTED Final   Yersinia enterocolitica NOT DETECTED NOT DETECTED Final   Vibrio species NOT DETECTED NOT DETECTED Final   Vibrio cholerae NOT DETECTED NOT DETECTED Final   Enteroaggregative E coli (EAEC) NOT DETECTED NOT DETECTED Final    Enteropathogenic E coli (EPEC) NOT DETECTED NOT DETECTED Final   Enterotoxigenic E coli (ETEC) NOT DETECTED NOT DETECTED Final   Shiga like toxin producing E coli (STEC) NOT DETECTED NOT DETECTED Final   Shigella/Enteroinvasive E coli (EIEC) NOT DETECTED NOT DETECTED Final   Cryptosporidium NOT DETECTED NOT DETECTED Final   Cyclospora cayetanensis NOT DETECTED NOT DETECTED Final   Entamoeba histolytica NOT DETECTED NOT DETECTED Final   Giardia lamblia NOT DETECTED NOT DETECTED Final   Adenovirus F40/41 NOT DETECTED NOT DETECTED Final   Astrovirus NOT DETECTED NOT DETECTED Final   Norovirus GI/GII NOT DETECTED NOT DETECTED Final   Rotavirus A NOT DETECTED NOT DETECTED Final   Sapovirus (I, II, IV, and V) NOT DETECTED NOT DETECTED Final    Comment: Performed at [facility], [address], [place], [place] [address]  Urine Culture     Status: Abnormal   Collection Time: 07/30/17  3:18 PM  Result Value Ref Range Status   Specimen Description   Final    URINE, RANDOM Performed at [facility], [address], [place], [place] [address]    Special Requests   Final    NONE Performed at [facility], [address] [place], [place] [address]    Culture (A)  Final    <10,000 COLONIES/mL INSIGNIFICANT GROWTH Performed at [facility], [address], [place], [place] [address]    Report Status 07/31/2017 FINAL  Final    Lab Basic Metabolic Panel: Recent Labs  Lab 07/29/17 0408 07/31/17 1212 08/27/17 0707  NA 136 131* 133*  K 3.4* 4.1 4.6  CL 109 107 108  CO2 23 12* 12*  GLUCOSE 124* 121* 143*  BUN 8 18 28*  CREATININE 0.65 0.86 1.32*  CALCIUM 7.9* 8.5* 8.6*  MG  --  1.8 2.0  PHOS  --   --  3.3   Liver Function Tests: No results for input(s): AST, ALT, ALKPHOS, BILITOT, PROT, ALBUMIN in the last 168 hours. No results for input(s): LIPASE, AMYLASE in the last 168 hours. No results for input(s): AMMONIA in the last 168  hours. CBC: Recent Labs  Lab 07/29/17 0408 07/31/17 1212 2017/08/27 0707  WBC 7.1 6.4 7.1  HGB 12.4 13.9 13.8  HCT 38.2 43.3 40.1  MCV 84.9 85.1 85.7  PLT 306 343 294   Cardiac Enzymes: No results for input(s): CKTOTAL, CKMB, CKMBINDEX, TROPONINI in the last 168 hours. Sepsis Labs: Recent Labs  Lab 07/29/17 0408 07/31/17 1212 27-Aug-2017 0707  WBC 7.1 6.4 7.1    Procedures/Operations  Consults  GI/oncology   Candida Vetter R Shanequia Kendrick 08/03/2017, 11:28 AM

## 2017-08-21 NOTE — Progress Notes (Signed)
Code blue note  CODE BLUE called was available on the floor and responded immediately.  Patient was found laying on the floor.  Patient was DNR and no CPR was attempted.  Was pulled out into the open space in the room.  Started on IV fluid bolus.  Oxygen placed.  Placing patient on cardiac monitor she was found to have asystole.  Initial consideration was given for vasovagal syncope.  With no improvement.  Patient was pronounced dead at 11:35 AM.  Discussed with husband.

## 2017-08-21 NOTE — Progress Notes (Signed)
Nurse responded to CNA requests to evaluate patient. Patient found without pulse. Code Blue initiated. DNR status confirmed. MD at bedside. Code blue cancelled.

## 2017-08-21 DEATH — deceased

## 2019-07-11 IMAGING — CT CT ABD-PELV W/ CM
2 of 5 series · 16 of 46 positions shown, 18 images · IV contrast (APPLIED)
Comparison: Report from abdominal ultrasound dated 09/14/2001

CLINICAL DATA: Ascending colon cancer staging.

EXAM:
CT ABDOMEN AND PELVIS WITH CONTRAST
TECHNIQUE: Multidetector CT imaging of the abdomen and pelvis was performed
using the standard protocol following bolus administration of
intravenous contrast.
CONTRAST:  100mL [id number] IOPAMIDOL (PUHL5Y-6NN) INJECTION 61%

[Series 2: routine abd/pel with · axial · 0.73mm/px · z∈[-478,-88]mm · 13 of 88 slices shown, 15 images]
[im 5/88  soft-tissue]
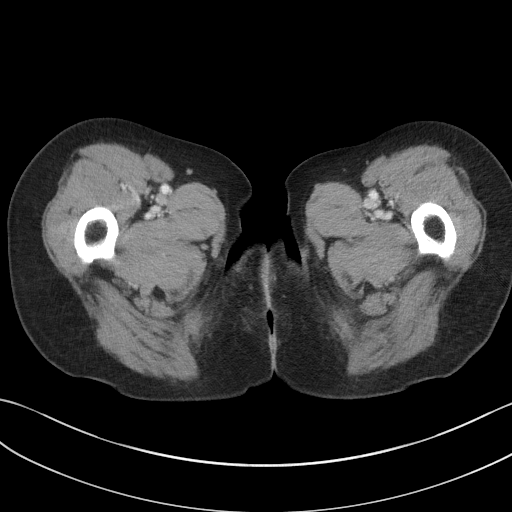
[im 5/88  bone]
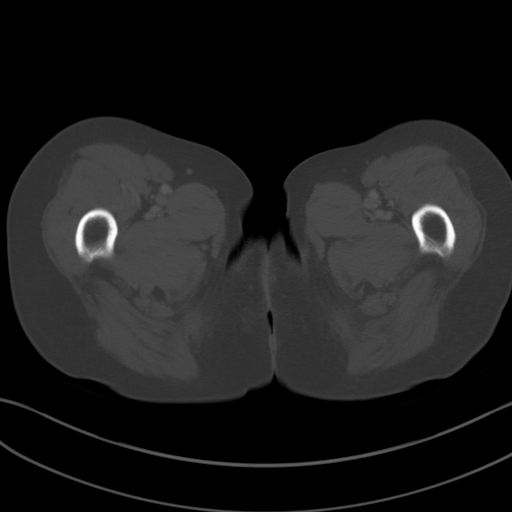
[im 14/88  soft-tissue]
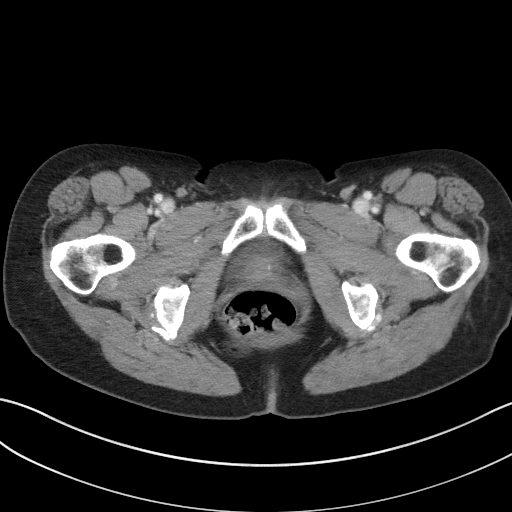
[im 19/88  soft-tissue]
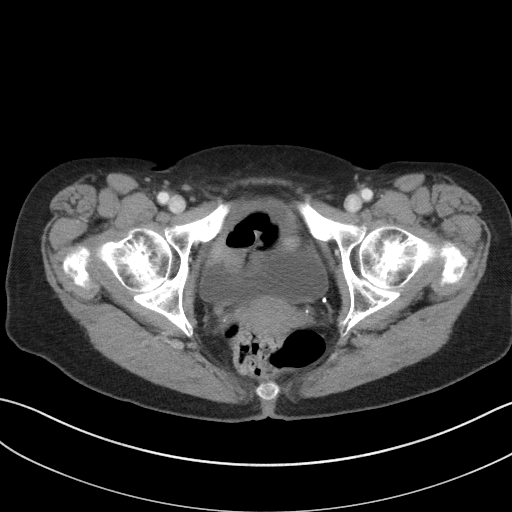
[im 23/88  soft-tissue]
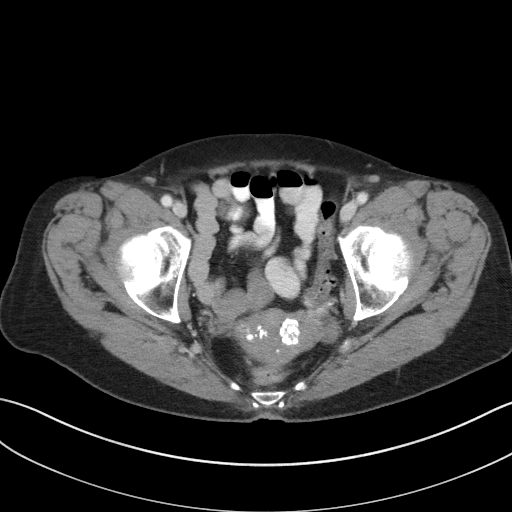
[im 33/88  soft-tissue]
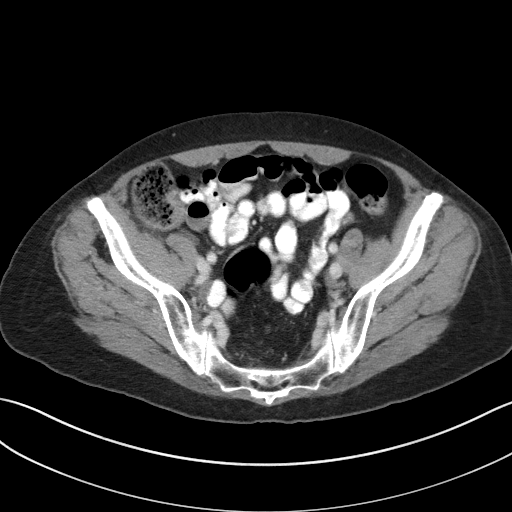
[im 37/88  soft-tissue]
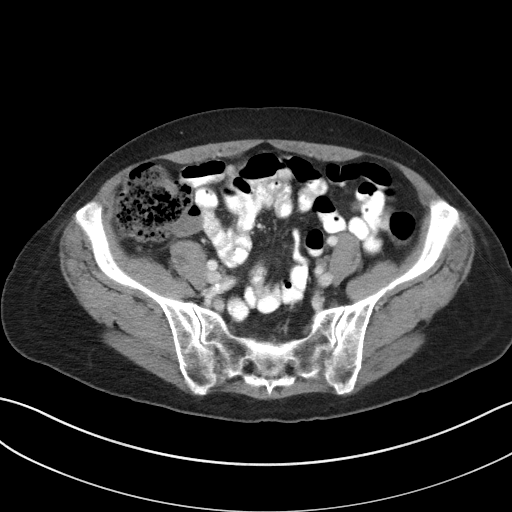
[im 46/88  soft-tissue]
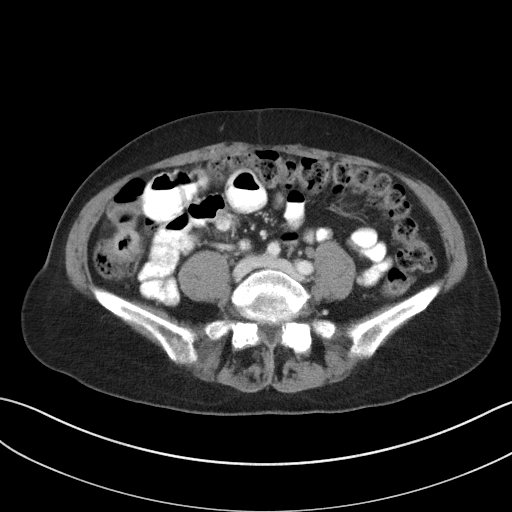
[im 51/88  soft-tissue]
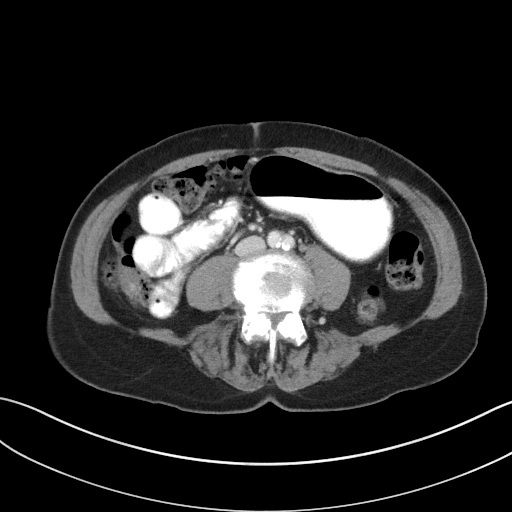
[im 55/88  soft-tissue]
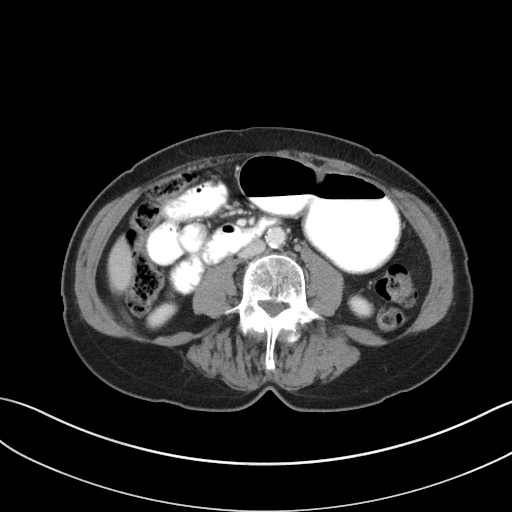
[im 55/88  bone]
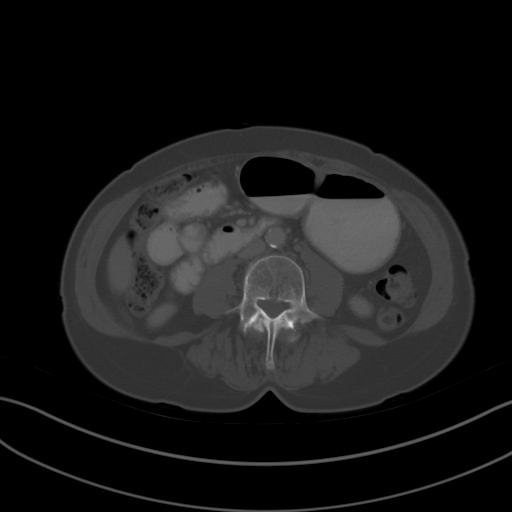
[im 65/88  soft-tissue]
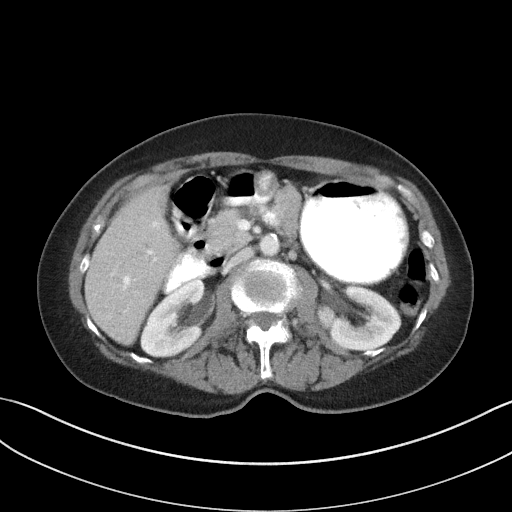
[im 69/88  soft-tissue]
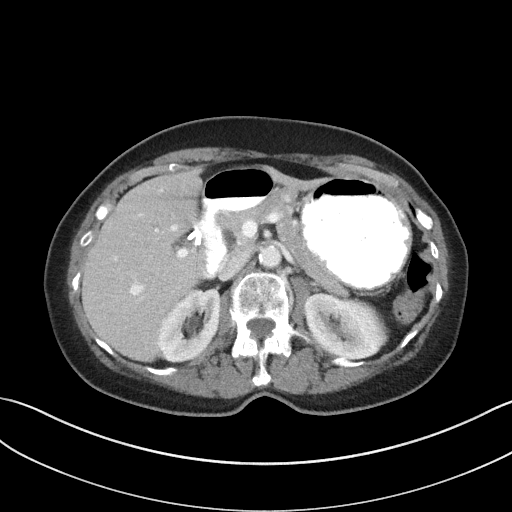
[im 74/88  soft-tissue]
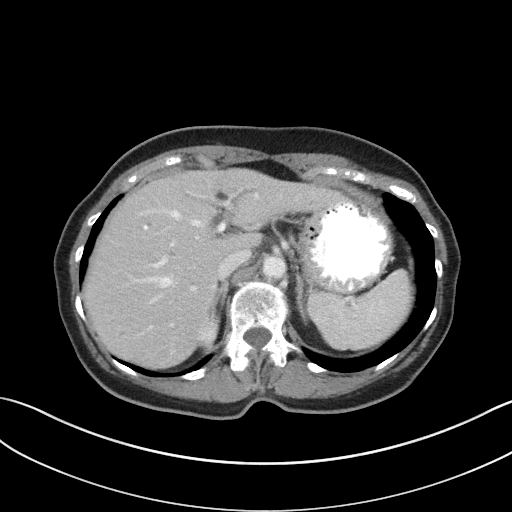
[im 83/88  soft-tissue]
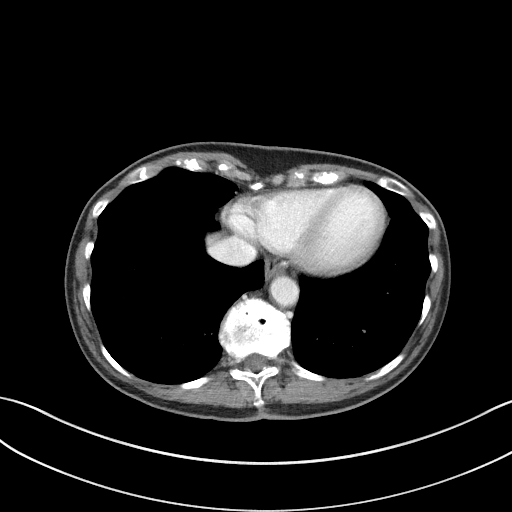

[Series 5: coronal st · coronal · 0.76mm/px · 3 of 74 slices shown]
[im 25/74  soft-tissue]
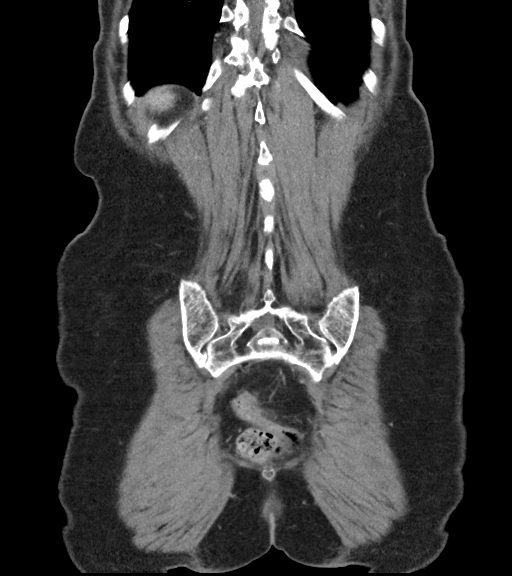
[im 33/74  soft-tissue]
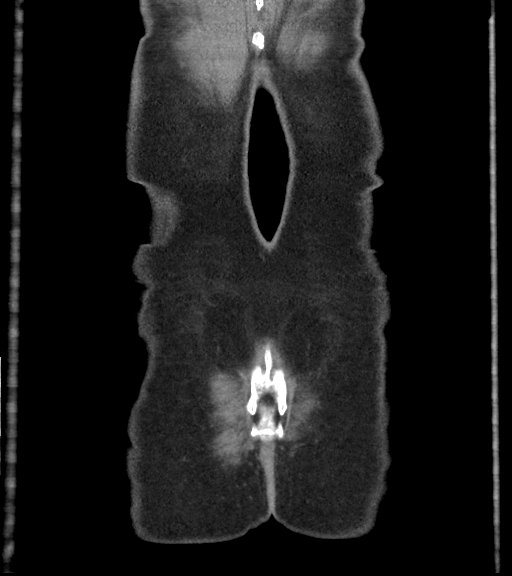
[im 41/74  soft-tissue]
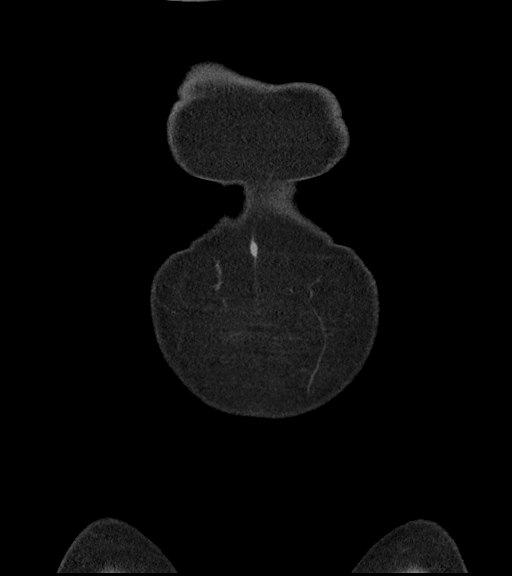

[16 of 46 positions shown; findings below may reference images not displayed]

FINDINGS: Lower chest: Linear subsegmental atelectasis or scarring in the
right middle lobe and in the left lower lobe. Descending thoracic
aortic atherosclerotic calcification. Mild calcification of the
mitral valve. Pectus excavatum.

Hepatobiliary: Cholecystectomy. Minimal intrahepatic biliary
dilatation with the common bile duct measuring about 7 mm in
diameter. No appreciable hepatic mass.

Pancreas: Unremarkable

Spleen: Unremarkable

Adrenals/Urinary Tract: Unremarkable

Stomach/Bowel: There is some localized wall thickening in the
ascending colon just above the ileocecal valve which could represent
a region of known tumor. This is shown for example on image 33/5.
Several small regional paracolic lymph nodes such as the 6 mm nearby
lymph node on image 49/2.

Sigmoid colon diverticulosis. Periampullary duodenal diverticulum on
image [DATE].

Vascular/Lymphatic: Aortoiliac atherosclerotic vascular disease. No
pathologic retroperitoneal or porta hepatis adenopathy.

Reproductive: Multiple calcified uterine fibroids measuring up to
2.1 cm in diameter. Mildly retroverted uterus. Adnexa unremarkable.

Other: No supplemental non-categorized findings.

Musculoskeletal: Grade 1 degenerative anterolisthesis at L4-5 with
impingement observed at all levels between L2 and S1 primarily due
to spondylosis and degenerative disc disease.
IMPRESSION: 1. There is some localized wall thickening in the ascending colon
likely corresponding to the region of the described mass. Several
tiny paracolic lymph nodes in this vicinity are nonspecific with
regard to malignant involvement. No findings of metastatic disease
to the liver or elsewhere in the abdomen.
2. Minimal intrahepatic biliary dilatation, nonspecific but
potentially a physiologic response to cholecystectomy.
3. Other imaging findings of potential clinical significance:
Aortoiliac atherosclerotic vascular disease. Multilevel lumbar
impingement. Calcified uterine fibroids. Sigmoid colon
diverticulosis. Periampullary duodenal diverticulum. Mild mitral
valve calcification.

## 2019-07-28 IMAGING — CT CT CHEST W/ CM
2 of 3 series · 15 of 36 positions shown, 18 images · IV contrast (iopamidol)
Comparison: abdomen and pelvis CT 05/30/2017.

CLINICAL DATA: Colon cancer.

EXAM:
CT CHEST WITH CONTRAST
TECHNIQUE: Multidetector CT imaging of the chest was performed during
intravenous contrast administration.
CONTRAST:  75mL [id number] IOPAMIDOL (ZX5SA0-OII) INJECTION 61%

[Series 2: axial st · axial · 0.62mm/px · z∈[-702,-440]mm · 12 of 155 slices shown, 15 images]
[im 12/155  mediastinal]
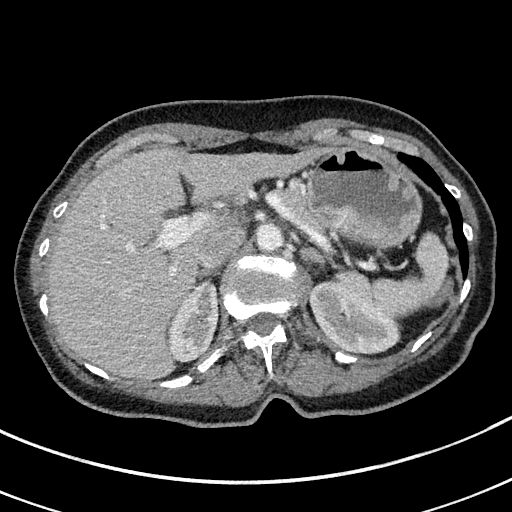
[im 12/155  lung]
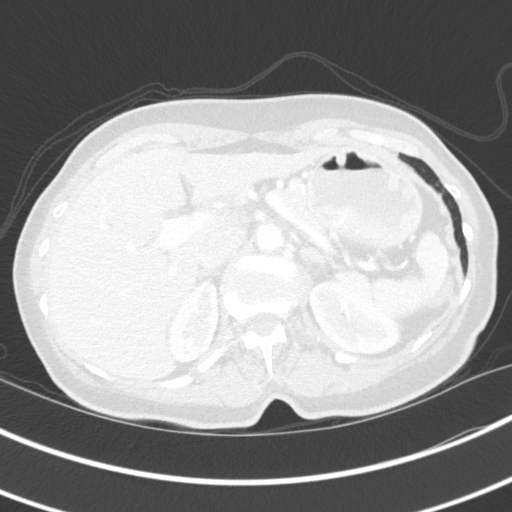
[im 23/155  lung]
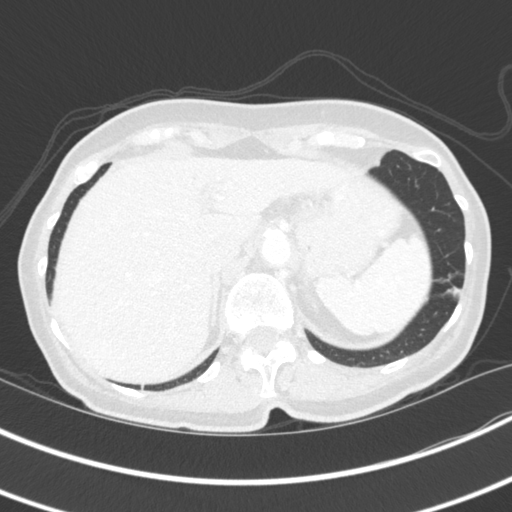
[im 35/155  lung]
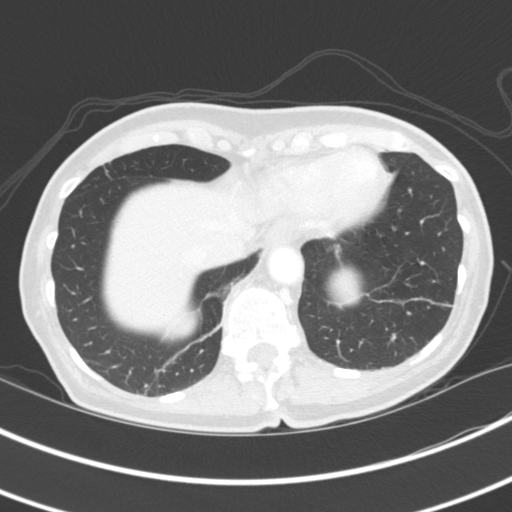
[im 46/155  lung]
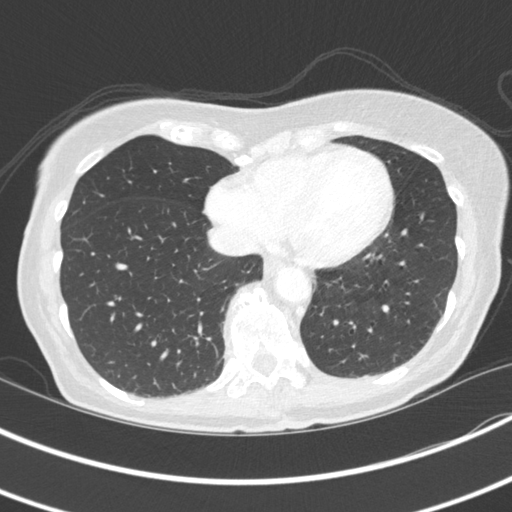
[im 58/155  mediastinal]
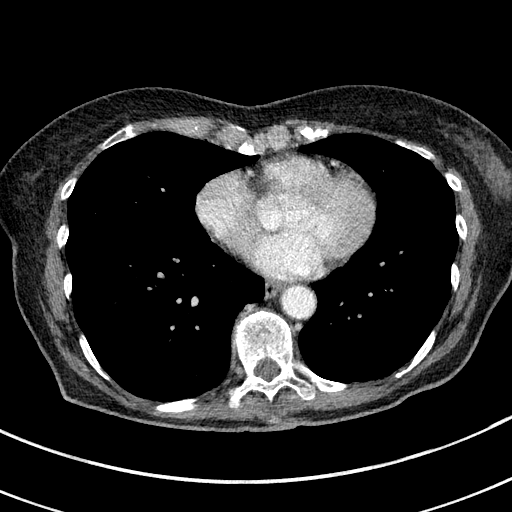
[im 58/155  lung]
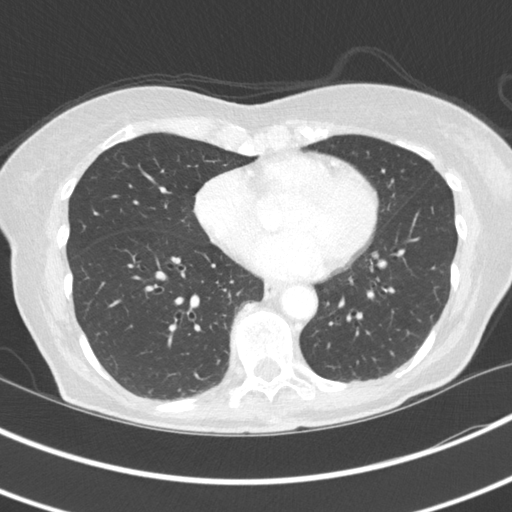
[im 69/155  lung]
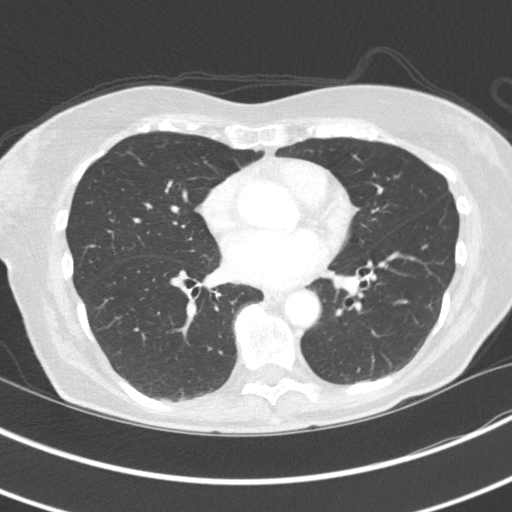
[im 86/155  lung]
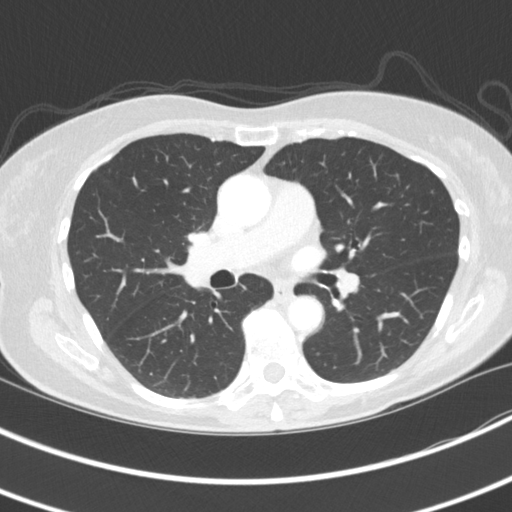
[im 97/155  lung]
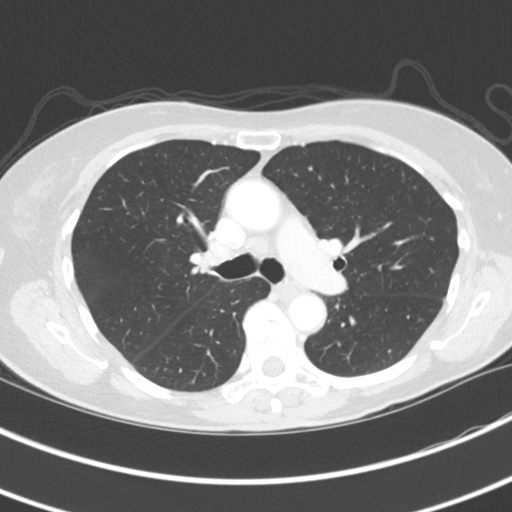
[im 109/155  mediastinal]
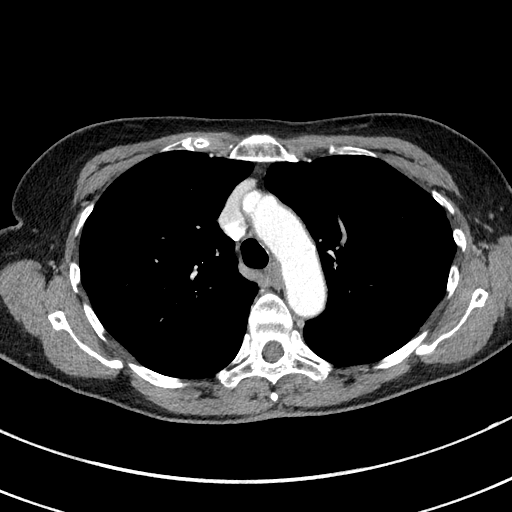
[im 109/155  lung]
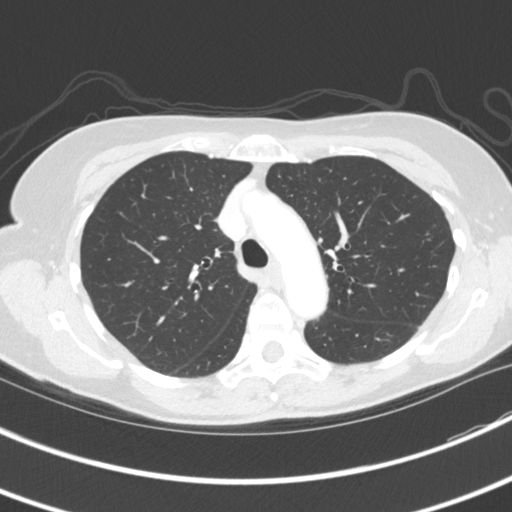
[im 120/155  lung]
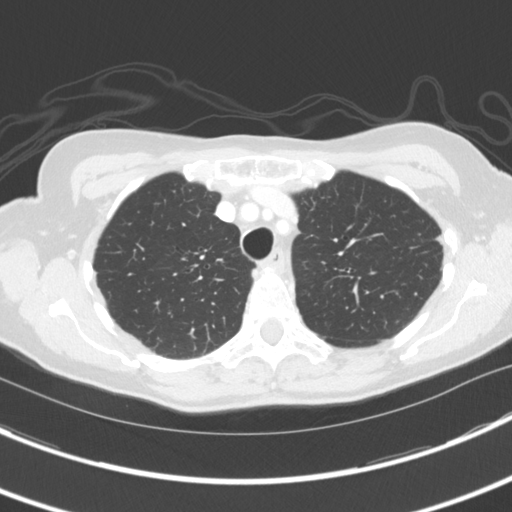
[im 132/155  lung]
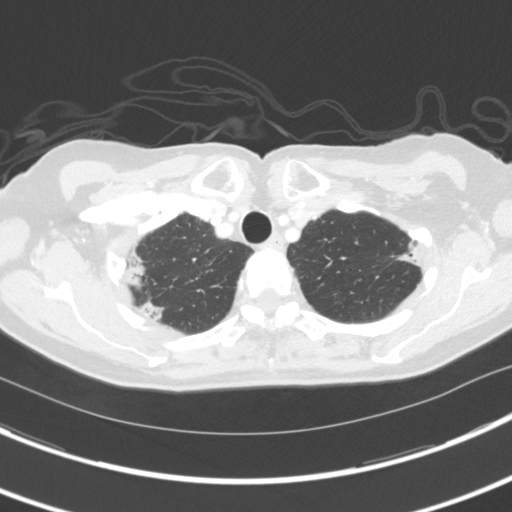
[im 143/155  lung]
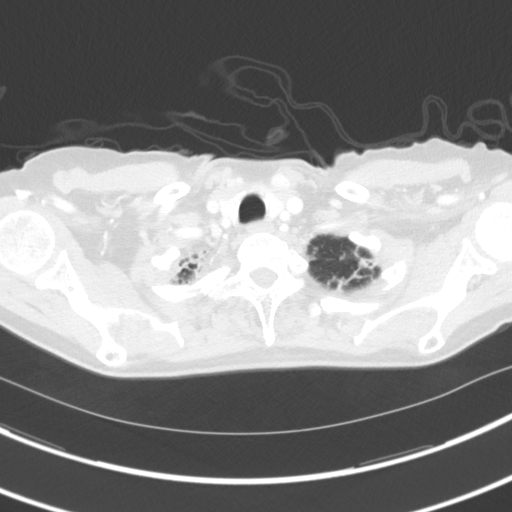

[Series 5: coronal · coronal · 0.62mm/px · 3 of 109 slices shown]
[im 22/109  lung]
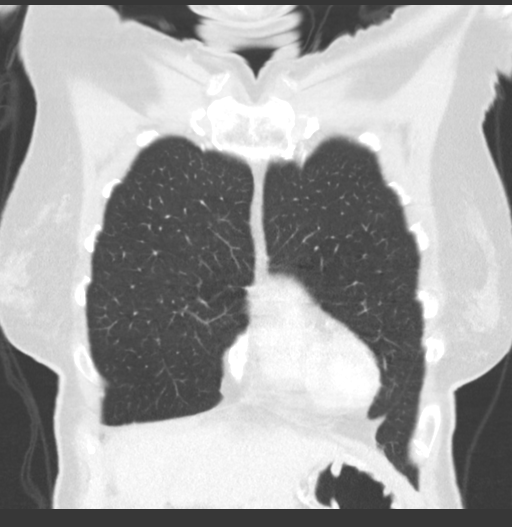
[im 44/109  lung]
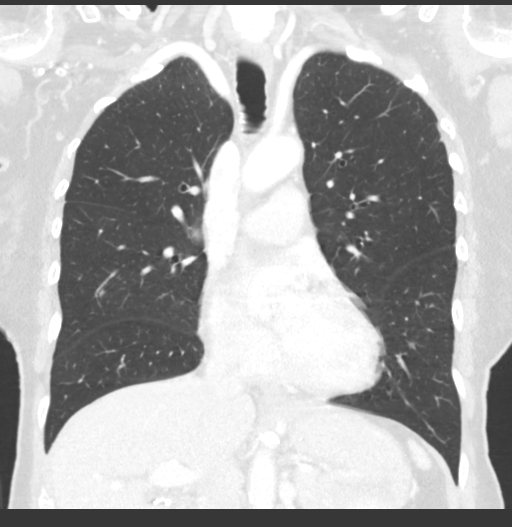
[im 65/109  lung]
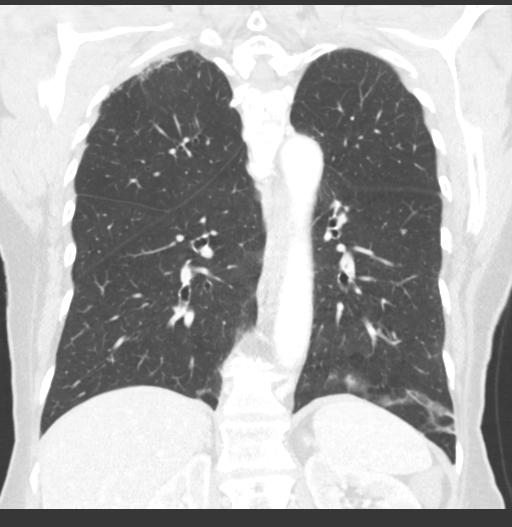

[15 of 36 positions shown; findings below may reference images not displayed]

FINDINGS: Cardiovascular: The heart size is normal. No pericardial effusion.
Atherosclerotic calcification is noted in the wall of the thoracic
aorta.

Mediastinum/Nodes: Multiple mediastinal lymph nodes are evident but
do not meet CT criteria for pathologic enlargement. There is no
hilar lymphadenopathy. The esophagus has normal imaging features.
There is no axillary lymphadenopathy.

Lungs/Pleura: Biapical pleural-parenchymal scarring is evident.
Subsegmental atelectasis or linear scarring noted in both lung
bases. There is no pulmonary nodule or mass to suggest pulmonary
metastatic involvement.

Upper Abdomen: Unremarkable.

Musculoskeletal: Bone windows reveal no worrisome lytic or sclerotic
osseous lesions.
IMPRESSION: 1. No evidence for metastatic disease in the chest.
2.  Aortic Atherosclerois ([id number])
